# Patient Record
Sex: Male | Born: 1985 | Race: Black or African American | Hispanic: No | Smoking: Current every day smoker
Health system: Southern US, Community
[De-identification: ages and names within clinical notes are randomized; demographics above are authoritative.]

## PROBLEM LIST (undated history)

## (undated) DIAGNOSIS — J40 Bronchitis, not specified as acute or chronic: Secondary | ICD-10-CM

## (undated) HISTORY — PX: NO PAST SURGERIES: SHX2092

---
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
## 2005-04-06 ENCOUNTER — Emergency Department (HOSPITAL_COMMUNITY): Admission: EM | Admit: 2005-04-06 | Discharge: 2005-04-07 | Payer: Self-pay | Admitting: Emergency Medicine

## 2011-07-11 ENCOUNTER — Encounter (HOSPITAL_COMMUNITY): Payer: Self-pay | Admitting: Emergency Medicine

## 2011-07-11 ENCOUNTER — Emergency Department (HOSPITAL_COMMUNITY): Payer: Self-pay

## 2011-07-11 DIAGNOSIS — IMO0002 Reserved for concepts with insufficient information to code with codable children: Secondary | ICD-10-CM | POA: Insufficient documentation

## 2011-07-11 DIAGNOSIS — Y92009 Unspecified place in unspecified non-institutional (private) residence as the place of occurrence of the external cause: Secondary | ICD-10-CM | POA: Insufficient documentation

## 2011-07-11 DIAGNOSIS — S63269A Dislocation of metacarpophalangeal joint of unspecified finger, initial encounter: Secondary | ICD-10-CM | POA: Insufficient documentation

## 2011-07-11 NOTE — ED Notes (Signed)
Pt punched refrigerator. Right hand swollen and deformed. Pt cant move at all. Radial pulse intact.

## 2011-07-12 ENCOUNTER — Emergency Department (HOSPITAL_COMMUNITY)
Admission: EM | Admit: 2011-07-12 | Discharge: 2011-07-12 | Disposition: A | Discharge status: Home / Self Care | Payer: Self-pay | Attending: Emergency Medicine | Admitting: Emergency Medicine

## 2011-07-12 ENCOUNTER — Emergency Department (HOSPITAL_COMMUNITY): Payer: Self-pay

## 2011-07-12 DIAGNOSIS — IMO0002 Reserved for concepts with insufficient information to code with codable children: Secondary | ICD-10-CM

## 2011-07-12 MED ORDER — HYDROCODONE-ACETAMINOPHEN 5-500 MG PO TABS: 1.0000 | ORAL_TABLET | Freq: Four times a day (QID) | ORAL | Status: DC | PRN

## 2011-07-12 NOTE — ED Provider Notes (Signed)
Medical screening examination/treatment/procedure(s) were conducted as a shared visit with non-physician practitioner(s) and myself.  I personally evaluated the patient during the encounter. Patient evaluated and dislocation reduced by myself and GS per documentation. Hand was splinted and post reduction x-ray reviewed. Patient is neurovascularly intact. Hand referral provided pain medications.   Sunnie Nielsen, MD 07/12/11 [phone]

## 2011-07-12 NOTE — ED Provider Notes (Signed)
History     CSN: [id number]  Arrival date & time 07/11/11  2325   First MD Initiated Contact with Patient 07/12/11 0015      Chief Complaint  Patient presents with  . Hand Injury    (Consider location/radiation/quality/duration/timing/severity/associated sxs/prior treatment) HPI Comments: Patient punched a refrigerator, less than 1 hour ago.  Has obvious deformity to the right fifth proximal metacarpal  Patient is a 26 y.o. male presenting with hand injury. The history is provided by the patient.  Hand Injury  The incident occurred less than 1 hour ago. The incident occurred at home. The injury mechanism was a direct blow. The pain is present in the right hand. Pertinent negatives include no fever.    No past medical history on file.  No past surgical history on file.  No family history on file.  History  Substance Use Topics  . Smoking status: Not on file  . Smokeless tobacco: Not on file  . Alcohol Use: Not on file      Review of Systems  Constitutional: Negative for fever.  Musculoskeletal: Positive for joint swelling.    Allergies  Penicillins  Home Medications   Current Outpatient Rx  Name Route Sig Dispense Refill  . HYDROCODONE-ACETAMINOPHEN 5-500 MG PO TABS Oral Take 1-2 tablets by mouth every 6 (six) hours as needed for pain. 15 tablet 0    BP 97/56  Pulse 118  Temp(Src) 97.6 F (36.4 C) (Oral)  Resp 18  SpO2 92%  Physical Exam  Constitutional: He is oriented to person, place, and time. He appears well-developed and well-nourished.  HENT:  Head: Normocephalic.  Neck: Normal range of motion.  Cardiovascular: Normal rate.   Musculoskeletal:       Obvious bony deformity of right hand at the proximal fifth metacarpal.  Color, sensation, cap refill less than 3 seconds.  Positive proximal and distal pulses-neurovascularly intact  Neurological: He is alert and oriented to person, place, and time.    ED Course  Reduction of  dislocation Date/Time: 07/12/2011 12:18 AM Performed by: Arman Filter Authorized by: Arman Filter Consent: Verbal consent obtained. Risks and benefits: risks, benefits and alternatives were discussed Consent given by: patient Patient understanding: patient states understanding of the procedure being performed Patient identity confirmed: verbally with patient Time out: Immediately prior to procedure a "time out" was called to verify the correct patient, procedure, equipment, support staff and site/side marked as required. Local anesthesia used: no Patient sedated: no Patient tolerance: Patient tolerated the procedure well with no immediate complications. Comments: Dr. Dierdre Highman, and I manually relocated the fifth metacarpal, without complication.  He is neurovascularly intact.  Full range of motion of the fourth and fifth finger right hand.  Cap refill less than 3 seconds color, and sensation normal   (including critical care time)  Labs Reviewed - No data to display Dg Hand Complete Right  07/12/2011  *RADIOLOGY REPORT*  Clinical Data: Post reduction radiographs  RIGHT HAND - COMPLETE 3+ VIEW  Comparison: Right hand radiographs - earlier same day  Findings:  Fine bony detail is obscured secondary to overlying support apparatus.  Interval relocation of previously identified dislocated fourth and fifth CMC joints.  No new fracture identified.  IMPRESSION: Interval relocation of the fourth and fifth CMC joints.  Original Report Authenticated By: Waynard Reeds, M.D.   Dg Hand Complete Right  07/12/2011  *RADIOLOGY REPORT*  Clinical Data: Right hand trauma/deformity.  RIGHT HAND - COMPLETE 3+ VIEW  Comparison:  None.  Findings: Soft tissue swelling overlies the ulnar aspect of the hand.  The fifth digit is dislocated dorsally at the carpal- metacarpal joint.  The fourth digit appears dorsally subluxed at the carpal-metacarpal joint and may be dislocated as well. Small radiopaque density along the  base of the fifth metacarpal bone may reflect a fracture.  IMPRESSION: Dislocation at the fifth carpal metacarpal joint.  Small radiopaque density along the base of the fifth metacarpal bone may reflect a fracture.  The fourth metacarpal bone also appears subluxed and may be dislocated.  Correlate with physical exam.  Original Report Authenticated By: Waneta Martins, M.D.     1. Dislocation of metacarpal joint       MDM  Carpal dislocation.  That has been relocated at time of discharge.  Splint has been placed.  Digits have been reexamined.  Cap refill pain is improved post reduction x-ray reviewed.  Good alignment.  No fracture seen        Arman Filter, NP 07/12/11 1191  Arman Filter, NP 07/12/11 0454  Arman Filter, NP 07/12/11 0454  Arman Filter, NP 07/12/11 [phone]

## 2011-07-12 NOTE — ED Notes (Signed)
Ortho tech reports he applied buddy tape and (R) hand splint. Confirmed w/Gail EDNP proper splint applied, she informs writer we are waiting for a (R) hand XR post reduction

## 2011-07-12 NOTE — Discharge Instructions (Signed)
Metacarpophalangeal Joint Dislocation You have a finger dislocation of the metacarpophalangeal (MCP) joint . This is the joint between the large bones in your hand and the bones of the finger that are closest to the hand (the knuckles). The most common fingers to dislocate in this area are the index finger and the thumb. Usually it occurs because of a hyper-extension injury. This means the finger or thumb has been forced to bend opposite of the way the fingers normally bend. A dislocation is a condition in which joint surfaces are forced into an abnormal relationship. These joint surfaces are normally next to each other. With this injury, the joint surfaces no longer butt against (oppose) each other. When this happens, X-rays are often taken. The x-rays are used to make sure a break in bone (fracture) is not present. These fractures are often difficult to bring back to normal position (reduce). If there is a fracture, you may require surgery. TREATMENT  If the joint is dislocated you will require a reduction (re-alignment) of the joint. This is done with a local anesthetic or regional anesthetic (make the arm numb). Once reduced the injury is usually treated by splinting the joint in a flexed position. This keeps the joint stable and unable to have the final degrees of extension. You may have the split for several weeks. If there are related (associated ) fractures, you may require surgery with repair and internal fixation. Your caregiver can discuss these options with you. HOME CARE INSTRUCTIONS   While awake, apply ice to the sore area for 15 to 20 minutes, 3 to 4 times per day for the first 2 days. Put the ice in a plastic bag. Place a towel between the bag of ice and your skin.   Keep your arm raised (elevated) above your heart when possible. This will lessen swelling.   Continue activities with your hand as directed   Only take over-the-counter or prescription medicines for pain, discomfort, or  fever as directed by your caregiver.  SEEK MEDICAL CARE IF:   You have an increase in bruising, swelling or pain in your finger   You notice coldness or numbness of your finger.   You do not get enough pain relief with medications.  SEEK IMMEDIATE MEDICAL CARE IF: Your finger is progressively numb or blue, or you have severe pain. MAKE SURE YOU:   Understand these instructions.   Will watch your condition.   Will get help right away if you are not doing well or get worse.  Document Released: 02/26/2000 Document Revised: 02/17/2011 Document Reviewed: 07/21/2008 [facility] Patient Information 2012 [place], [place]. Please keep the splint in place since he was seen by Dr. Park Pope, please call and make an appointment for 7-10 days from now

## 2011-07-14 ENCOUNTER — Encounter (HOSPITAL_COMMUNITY): Payer: Self-pay | Admitting: *Deleted

## 2011-07-14 ENCOUNTER — Encounter (HOSPITAL_COMMUNITY): Payer: Self-pay | Admitting: Pharmacy Technician

## 2011-07-14 ENCOUNTER — Other Ambulatory Visit: Payer: Self-pay | Admitting: Orthopedic Surgery

## 2011-07-14 MED ORDER — CHLORHEXIDINE GLUCONATE 4 % EX LIQD: 60.0000 mL | Freq: Once | CUTANEOUS | Status: DC

## 2011-07-15 ENCOUNTER — Encounter (HOSPITAL_COMMUNITY): Payer: Self-pay | Admitting: *Deleted

## 2011-07-15 ENCOUNTER — Encounter (HOSPITAL_COMMUNITY)
Admission: RE | Disposition: A | Discharge status: Home / Self Care | Payer: Self-pay | Source: Ambulatory Visit | Attending: Orthopedic Surgery

## 2011-07-15 ENCOUNTER — Encounter (HOSPITAL_COMMUNITY): Payer: Self-pay | Admitting: Anesthesiology

## 2011-07-15 ENCOUNTER — Ambulatory Visit (HOSPITAL_COMMUNITY): Payer: Self-pay | Admitting: Anesthesiology

## 2011-07-15 ENCOUNTER — Ambulatory Visit (HOSPITAL_COMMUNITY)
Admission: RE | Admit: 2011-07-15 | Discharge: 2011-07-15 | Disposition: A | Discharge status: Home / Self Care | Payer: Self-pay | Source: Ambulatory Visit | Attending: Orthopedic Surgery | Admitting: Orthopedic Surgery

## 2011-07-15 DIAGNOSIS — X58XXXA Exposure to other specified factors, initial encounter: Secondary | ICD-10-CM | POA: Insufficient documentation

## 2011-07-15 DIAGNOSIS — S62309A Unspecified fracture of unspecified metacarpal bone, initial encounter for closed fracture: Secondary | ICD-10-CM

## 2011-07-15 DIAGNOSIS — F17200 Nicotine dependence, unspecified, uncomplicated: Secondary | ICD-10-CM | POA: Insufficient documentation

## 2011-07-15 DIAGNOSIS — S62109A Fracture of unspecified carpal bone, unspecified wrist, initial encounter for closed fracture: Secondary | ICD-10-CM | POA: Insufficient documentation

## 2011-07-15 HISTORY — PX: PERCUTANEOUS PINNING: SHX2209

## 2011-07-15 LAB — SURGICAL PCR SCREEN
MRSA, PCR: NEGATIVE
Staphylococcus aureus: NEGATIVE

## 2011-07-15 SURGERY — PINNING, EXTREMITY, PERCUTANEOUS
Anesthesia: General | Site: Hand | Laterality: Right | Wound class: Clean

## 2011-07-15 MED ORDER — MIDAZOLAM HCL 5 MG/5ML IJ SOLN
INTRAMUSCULAR | Status: DC | PRN
  Administered 2011-07-15: 2 mg via INTRAVENOUS

## 2011-07-15 MED ORDER — ONDANSETRON HCL 4 MG/2ML IJ SOLN
INTRAMUSCULAR | Status: DC | PRN
  Administered 2011-07-15: 4 mg via INTRAVENOUS

## 2011-07-15 MED ORDER — MUPIROCIN 2 % EX OINT
TOPICAL_OINTMENT | CUTANEOUS | Status: AC
  Administered 2011-07-15: 1 via NASAL
  Filled 2011-07-15: qty 22

## 2011-07-15 MED ORDER — HYDROMORPHONE HCL PF 1 MG/ML IJ SOLN
0.2500 mg | INTRAMUSCULAR | Status: DC | PRN
  Administered 2011-07-15: 0.5 mg via INTRAVENOUS

## 2011-07-15 MED ORDER — CEFAZOLIN SODIUM 1-5 GM-% IV SOLN
INTRAVENOUS | Status: DC | PRN
  Administered 2011-07-15: 1 g via INTRAVENOUS

## 2011-07-15 MED ORDER — FENTANYL CITRATE 0.05 MG/ML IJ SOLN
INTRAMUSCULAR | Status: DC | PRN
  Administered 2011-07-15 (×3): 50 ug via INTRAVENOUS

## 2011-07-15 MED ORDER — PROPOFOL 10 MG/ML IV EMUL
INTRAVENOUS | Status: DC | PRN
  Administered 2011-07-15: 200 mg via INTRAVENOUS

## 2011-07-15 MED ORDER — CEFAZOLIN SODIUM 1-5 GM-% IV SOLN
INTRAVENOUS | Status: AC
  Filled 2011-07-15: qty 50

## 2011-07-15 MED ORDER — LIDOCAINE HCL 1 % IJ SOLN
INTRAMUSCULAR | Status: DC | PRN
  Administered 2011-07-15: 50 mg via INTRADERMAL

## 2011-07-15 MED ORDER — LACTATED RINGERS IV SOLN
INTRAVENOUS | Status: DC
  Administered 2011-07-15: 14:00:00 via INTRAVENOUS

## 2011-07-15 MED ORDER — LACTATED RINGERS IV SOLN
INTRAVENOUS | Status: DC | PRN
  Administered 2011-07-15: 15:00:00 via INTRAVENOUS

## 2011-07-15 MED ORDER — BUPIVACAINE HCL (PF) 0.25 % IJ SOLN
INTRAMUSCULAR | Status: DC | PRN
  Administered 2011-07-15: 7 mL

## 2011-07-15 MED ORDER — OXYCODONE-ACETAMINOPHEN 5-325 MG PO TABS: 1.0000 | ORAL_TABLET | ORAL | Status: AC | PRN

## 2011-07-15 MED ORDER — ONDANSETRON HCL 4 MG/2ML IJ SOLN: 4.0000 mg | Freq: Four times a day (QID) | INTRAMUSCULAR | Status: DC | PRN

## 2011-07-15 SURGICAL SUPPLY — 43 items
BANDAGE ELASTIC 3 VELCRO ST LF (GAUZE/BANDAGES/DRESSINGS) IMPLANT
BANDAGE ELASTIC 4 VELCRO ST LF (GAUZE/BANDAGES/DRESSINGS) ×2 IMPLANT
BANDAGE GAUZE ELAST BULKY 4 IN (GAUZE/BANDAGES/DRESSINGS) ×2 IMPLANT
BNDG CMPR 9X4 STRL LF SNTH (GAUZE/BANDAGES/DRESSINGS) ×1
BNDG ESMARK 4X9 LF (GAUZE/BANDAGES/DRESSINGS) ×2 IMPLANT
CAP PIN ORTHO PINK (CAP) IMPLANT
CAP PIN PROTECTOR ORTHO WHT (CAP) IMPLANT
CLOTH BEACON ORANGE TIMEOUT ST (SAFETY) ×2 IMPLANT
CORDS BIPOLAR (ELECTRODE) IMPLANT
CUFF TOURNIQUET SINGLE 18IN (TOURNIQUET CUFF) ×2 IMPLANT
CUFF TOURNIQUET SINGLE 24IN (TOURNIQUET CUFF) IMPLANT
DRAPE SURG 17X23 STRL (DRAPES) ×2 IMPLANT
DRAPE U-SHAPE 47X51 STRL (DRAPES) IMPLANT
ELECT REM PT RETURN 9FT ADLT (ELECTROSURGICAL)
ELECTRODE REM PT RTRN 9FT ADLT (ELECTROSURGICAL) IMPLANT
GAUZE XEROFORM 1X8 LF (GAUZE/BANDAGES/DRESSINGS) ×2 IMPLANT
GLOVE BIO SURGEON STRL SZ 6 (GLOVE) ×2 IMPLANT
GLOVE BIO SURGEON STRL SZ8.5 (GLOVE) ×2 IMPLANT
GLOVE BIOGEL PI IND STRL 6.5 (GLOVE) ×1 IMPLANT
GLOVE BIOGEL PI IND STRL 7.5 (GLOVE) ×1 IMPLANT
GLOVE BIOGEL PI INDICATOR 6.5 (GLOVE) ×1
GLOVE BIOGEL PI INDICATOR 7.5 (GLOVE) ×1
GLOVE SURG SS PI 7.5 STRL IVOR (GLOVE) ×2 IMPLANT
GOWN SRG XL XLNG 56XLVL 4 (GOWN DISPOSABLE) ×1 IMPLANT
GOWN STRL NON-REIN LRG LVL3 (GOWN DISPOSABLE) ×2 IMPLANT
GOWN STRL NON-REIN XL XLG LVL4 (GOWN DISPOSABLE) ×2
GOWN STRL REIN XL XLG (GOWN DISPOSABLE) ×2 IMPLANT
K-WIRE SMTH SNGL TROCAR .028X4 (WIRE)
KIT BASIN OR (CUSTOM PROCEDURE TRAY) ×2 IMPLANT
KIT ROOM TURNOVER OR (KITS) ×2 IMPLANT
KWIRE SMTH SNGL TROCAR .028X4 (WIRE) IMPLANT
MANIFOLD NEPTUNE II (INSTRUMENTS) IMPLANT
NEEDLE HYPO 25GX1X1/2 BEV (NEEDLE) ×2 IMPLANT
NS IRRIG 1000ML POUR BTL (IV SOLUTION) ×2 IMPLANT
PACK ORTHO EXTREMITY (CUSTOM PROCEDURE TRAY) ×2 IMPLANT
PAD ARMBOARD 7.5X6 YLW CONV (MISCELLANEOUS) ×2 IMPLANT
PAD CAST 4YDX4 CTTN HI CHSV (CAST SUPPLIES) ×1 IMPLANT
PADDING CAST COTTON 4X4 STRL (CAST SUPPLIES) ×2
SPONGE GAUZE 4X4 12PLY (GAUZE/BANDAGES/DRESSINGS) ×2 IMPLANT
TOWEL OR 17X24 6PK STRL BLUE (TOWEL DISPOSABLE) ×2 IMPLANT
TOWEL OR 17X26 10 PK STRL BLUE (TOWEL DISPOSABLE) ×2 IMPLANT
UNDERPAD 30X30 INCONTINENT (UNDERPADS AND DIAPERS) ×2 IMPLANT
WATER STERILE IRR 1000ML POUR (IV SOLUTION) IMPLANT

## 2011-07-15 NOTE — Brief Op Note (Signed)
07/15/2011  3:49 PM  PATIENT:  Darius Bailey  26 y.o. male  PRE-OPERATIVE DIAGNOSIS:  RIGHT SMALL/RING CMC DISLOCATION  POST-OPERATIVE DIAGNOSIS:  RIGHT SMALL/RING CMC DISLOCATION  PROCEDURE:  Procedure(s) (LRB): PERCUTANEOUS PINNING EXTREMITY (Right)  SURGEON:  Surgeon(s) and Role:    * Marlowe Shores, MD - Primary  PHYSICIAN ASSISTANT:   ASSISTANTS: none   ANESTHESIA:   general  EBL:  Total I/O In: 1000 [I.V.:1000] Out: -   BLOOD ADMINISTERED:none  DRAINS: none   LOCAL MEDICATIONS USED:  MARCAINE   10cc  SPECIMEN:  No Specimen  DISPOSITION OF SPECIMEN:  N/A  COUNTS:  YES  TOURNIQUET:   Total Tourniquet Time Documented: Upper Arm (Right) - 8 minutes  DICTATION: .Other Dictation: Dictation Number [id number]  PLAN OF CARE: Discharge to home after PACU  PATIENT DISPOSITION:  PACU - hemodynamically stable.   Delay start of Pharmacological VTE agent (>24hrs) due to surgical blood loss or risk of bleeding: not applicable

## 2011-07-15 NOTE — H&P (Signed)
Darius Bailey is an 26 y.o. male.   Chief Complaint: right hand pain HPI: right 4 and 5 cmcf fx/dx  History reviewed. No pertinent past medical history.  Past Surgical History  Procedure Date  . No past surgeries     Family History  Problem Relation Age of Onset  . Anesthesia problems Neg Hx    Social History:  reports that he has been smoking Cigarettes.  He has been smoking about .25 packs per day. He does not have any smokeless tobacco history on file. He reports that he drinks alcohol. He reports that he uses illicit drugs (Marijuana) about once per week.  Allergies:  Allergies  Allergen Reactions  . Penicillins Other (See Comments)    unknown    Medications Prior to Admission  Medication Sig Dispense Refill  . HYDROcodone-acetaminophen (VICODIN) 5-500 MG per tablet Take 1-2 tablets by mouth every 6 (six) hours as needed. pain        Results for orders placed during the hospital encounter of 07/15/11 (from the past 48 hour(s))  SURGICAL PCR SCREEN     Status: Normal   Collection Time   07/15/11 11:28 AM      Component Value Range Comment   MRSA, PCR NEGATIVE  NEGATIVE     Staphylococcus aureus NEGATIVE  NEGATIVE     No results found.  Review of Systems  All other systems reviewed and are negative.    Blood pressure 104/67, pulse 54, temperature 98 F (36.7 C), temperature source Oral, resp. rate 20, height 5\' 9"  (1.753 m), weight 77.111 kg (170 lb), SpO2 99.00%. Physical Exam  Constitutional: He is oriented to person, place, and time. He appears well-developed and well-nourished.  HENT:  Head: Normocephalic and atraumatic.  Cardiovascular: Normal rate.   Respiratory: Effort normal.  Musculoskeletal:       Right hand: He exhibits tenderness, bony tenderness and swelling.  Neurological: He is alert and oriented to person, place, and time.  Skin: Skin is warm.  Psychiatric: He has a normal mood and affect. His speech is normal and behavior is normal. Thought  content normal.     Assessment/Plan As above   Plan reduction and pinning Anesa Fronek A 07/15/2011, 2:44 PM

## 2011-07-15 NOTE — Transfer of Care (Signed)
Immediate Anesthesia Transfer of Care Note  Patient: Darius Bailey  Procedure(s) Performed: Procedure(s) (LRB): PERCUTANEOUS PINNING EXTREMITY (Right)  Patient Location: PACU  Anesthesia Type: General  Level of Consciousness: awake, sedated and patient cooperative  Airway & Oxygen Therapy: Patient Spontanous Breathing and Patient connected to nasal cannula oxygen  Post-op Assessment: Report given to PACU RN and Post -op Vital signs reviewed and stable  Post vital signs: stable  Complications: No apparent anesthesia complications

## 2011-07-15 NOTE — Anesthesia Postprocedure Evaluation (Signed)
  Anesthesia Post-op Note  Patient: Darius Bailey  Procedure(s) Performed: Procedure(s) (LRB): PERCUTANEOUS PINNING EXTREMITY (Right)  Patient Location: PACU  Anesthesia Type: General  Level of Consciousness: awake, oriented, sedated and patient cooperative  Airway and Oxygen Therapy: Patient Spontanous Breathing and Patient connected to nasal cannula oxygen  Post-op Pain: mild  Post-op Assessment: Post-op Vital signs reviewed, Patient's Cardiovascular Status Stable, Respiratory Function Stable, Patent Airway, No signs of Nausea or vomiting and Pain level controlled  Post-op Vital Signs: stable  Complications: No apparent anesthesia complications

## 2011-07-15 NOTE — Preoperative (Signed)
Beta Blockers   Reason not to administer Beta Blockers:Not Applicable 

## 2011-07-15 NOTE — Op Note (Signed)
See dictated note [phone]

## 2011-07-15 NOTE — Anesthesia Preprocedure Evaluation (Addendum)
Anesthesia Evaluation  Patient identified by MRN, date of birth, ID band Patient awake    Reviewed: Allergy & Precautions, H&P , NPO status , Patient's Chart, lab work & pertinent test results  Airway Mallampati: II  Neck ROM: full    Dental  (+) Teeth Intact and Dental Advisory Given   Pulmonary Current Smoker,          Cardiovascular     Neuro/Psych    GI/Hepatic   Endo/Other    Renal/GU      Musculoskeletal   Abdominal   Peds  Hematology   Anesthesia Other Findings   Reproductive/Obstetrics                          Anesthesia Physical Anesthesia Plan  ASA: II  Anesthesia Plan: General   Post-op Pain Management:    Induction: Intravenous  Airway Management Planned: LMA  Additional Equipment:   Intra-op Plan:   Post-operative Plan:   Informed Consent: I have reviewed the patients History and Physical, chart, labs and discussed the procedure including the risks, benefits and alternatives for the proposed anesthesia with the patient or authorized representative who has indicated his/her understanding and acceptance.     Plan Discussed with: CRNA and Surgeon  Anesthesia Plan Comments:         Anesthesia Quick Evaluation

## 2011-07-15 NOTE — Op Note (Signed)
[name], Darius Bailey NO.:  [id number]  MEDICAL RECORD NO.:  [id number]  LOCATION:  [facility]                         FACILITY:  [facility]  PHYSICIAN:  Artist Pais. Dylyn Mclaren, M.D.DATE OF BIRTH:  11/07/1985  DATE OF PROCEDURE:  07/15/2011 DATE OF DISCHARGE:  07/15/2011                              OPERATIVE REPORT   PREOPERATIVE DIAGNOSIS:  Right hand 4th and 5th CMC fracture dislocations.  POSTOPERATIVE DIAGNOSIS:  Right hand 4th and 5th CMC fracture dislocations.  PROCEDURES:  Closed reduction and pinning above with 0.062 K-wire x2.  SURGEON:  Artist Pais. Mina Marble, M.D.  ASSISTANT:  None.  ANESTHESIA:  General.  COMPLICATIONS:  No complication.  DRAINS:  No drains.  DESCRIPTION OF PROCEDURE:  The patient was taken to operating suite. After induction of general anesthesia, right upper extremity was prepped and draped in sterile fashion.  An Esmarch was used to exsanguinate the limb.  Tourniquet was then inflated to 250 mmHg.  At this point in time, longitudinal traction and downward pressure was placed on the 4th and 5th CMC joints which had been reduced in the ER but had subluxed dorsally. Reduction was performed.  A 0.062 K-wire was then driven from ulnar to radial from the 5th to 4th into the long finger to maintain reduction.  These were driven from volar to dorsal, from ulnar to radial.  Intraoperative fluoroscopy revealed adequate reduction in AP and lateral oblique view.  The K-wires were cut outside the skin and bent upon themselves.  Dressed with Xeroform, 4x4s, fluffs, and ulnar gutter splint.  The patient tolerated the procedure well and went to recovery room in stable fashion.     Artist Pais Mina Marble, M.D.     MAW/MEDQ  D:  07/15/2011  T:  07/15/2011  Job:  [id number]

## 2011-07-15 NOTE — Discharge Instructions (Signed)
Hand Fracture Your caregiver has diagnosed you with a fractured (broken) bone in your hand. If the bones are in good position and the hand is properly immobilized and rested, these injuries will usually heal in 3 to 6 weeks. A cast, splint, or bulky bandage is usually applied to keep the fracture site from moving. Do not remove the splint or cast until your caregiver approves. If the fracture is unstable or the bones are not aligned properly, surgery may be needed. Keep your hand raised (elevated) above the level of your heart as much as possible for the next 2 to 3 days until the swelling and pain are better. Apply ice packs for 15 to 20 minutes every 3 to 4 hours to help control the pain and swelling. See your caregiver or an orthopedic specialist as directed for follow-up care to make sure the fracture is beginning to heal properly. SEEK IMMEDIATE MEDICAL CARE IF:   You notice your fingers are cold, numb, crooked, or the pain of your injury is severe.   You are not improving or seem to be getting worse.   You have questions or concerns.  Document Released: 04/07/2004 Document Revised: 02/17/2011 Document Reviewed: 08/26/2008 [employer] Patient Information 2012 [employer]. 

## 2011-07-18 ENCOUNTER — Encounter (HOSPITAL_COMMUNITY): Payer: Self-pay | Admitting: Orthopedic Surgery

## 2011-07-28 ENCOUNTER — Ambulatory Visit: Payer: Self-pay | Attending: Orthopedic Surgery | Admitting: Occupational Therapy

## 2011-08-16 ENCOUNTER — Encounter: Payer: Self-pay | Admitting: Occupational Therapy

## 2012-03-23 ENCOUNTER — Emergency Department (HOSPITAL_COMMUNITY)
Admission: EM | Admit: 2012-03-23 | Discharge: 2012-03-23 | Disposition: A | Discharge status: Home / Self Care | Payer: Self-pay | Attending: Emergency Medicine | Admitting: Emergency Medicine

## 2012-03-23 ENCOUNTER — Encounter (HOSPITAL_COMMUNITY): Payer: Self-pay

## 2012-03-23 ENCOUNTER — Emergency Department (HOSPITAL_COMMUNITY): Payer: Self-pay

## 2012-03-23 DIAGNOSIS — J069 Acute upper respiratory infection, unspecified: Secondary | ICD-10-CM | POA: Insufficient documentation

## 2012-03-23 DIAGNOSIS — Z8709 Personal history of other diseases of the respiratory system: Secondary | ICD-10-CM | POA: Insufficient documentation

## 2012-03-23 DIAGNOSIS — F17200 Nicotine dependence, unspecified, uncomplicated: Secondary | ICD-10-CM | POA: Insufficient documentation

## 2012-03-23 DIAGNOSIS — R51 Headache: Secondary | ICD-10-CM | POA: Insufficient documentation

## 2012-03-23 DIAGNOSIS — R6883 Chills (without fever): Secondary | ICD-10-CM | POA: Insufficient documentation

## 2012-03-23 DIAGNOSIS — R319 Hematuria, unspecified: Secondary | ICD-10-CM | POA: Insufficient documentation

## 2012-03-23 DIAGNOSIS — R52 Pain, unspecified: Secondary | ICD-10-CM | POA: Insufficient documentation

## 2012-03-23 HISTORY — DX: Bronchitis, not specified as acute or chronic: J40

## 2012-03-23 LAB — URINALYSIS, ROUTINE W REFLEX MICROSCOPIC
Bilirubin Urine: NEGATIVE
Nitrite: NEGATIVE
Specific Gravity, Urine: 1.02 (ref 1.005–1.030)
Urobilinogen, UA: 1 mg/dL (ref 0.0–1.0)

## 2012-03-23 LAB — CBC WITH DIFFERENTIAL/PLATELET
Eosinophils Absolute: 0.2 10*3/uL (ref 0.0–0.7)
Eosinophils Relative: 6 % — ABNORMAL HIGH (ref 0–5)
MCH: 31.6 pg (ref 26.0–34.0)
Monocytes Absolute: 0.8 10*3/uL (ref 0.1–1.0)
Neutrophils Relative %: 31 % — ABNORMAL LOW (ref 43–77)
Platelets: 157 10*3/uL (ref 150–400)
RBC: 4.52 MIL/uL (ref 4.22–5.81)

## 2012-03-23 LAB — URINE MICROSCOPIC-ADD ON

## 2012-03-23 LAB — COMPREHENSIVE METABOLIC PANEL
ALT: 9 U/L (ref 0–53)
AST: 16 U/L (ref 0–37)
Albumin: 3.8 g/dL (ref 3.5–5.2)
Calcium: 8.9 mg/dL (ref 8.4–10.5)
Sodium: 139 mEq/L (ref 135–145)
Total Protein: 6.8 g/dL (ref 6.0–8.3)

## 2012-03-23 MED ORDER — HYDROCODONE-HOMATROPINE 5-1.5 MG/5ML PO SYRP: 5.0000 mL | ORAL_SOLUTION | Freq: Four times a day (QID) | ORAL | Status: DC | PRN

## 2012-03-23 MED ORDER — GUAIFENESIN ER 600 MG PO TB12: 1200.0000 mg | ORAL_TABLET | Freq: Two times a day (BID) | ORAL | Status: DC

## 2012-03-23 NOTE — ED Notes (Signed)
Pt denies N/V/D at this time. Reports minimal diarrhea 3 days ago that resolved on its own.

## 2012-03-23 NOTE — ED Notes (Signed)
Cough, body aches, headache, diarheea, decreased appetite, symptoms started Tuesday,.

## 2012-03-23 NOTE — ED Provider Notes (Signed)
History     CSN: [id number]  Arrival date & time 03/23/12  4098   First MD Initiated Contact with Patient 03/23/12 0901      Chief Complaint  Patient presents with  . Cough    (Consider location/radiation/quality/duration/timing/severity/associated sxs/prior treatment) HPI No presents emergency department with chief complaint of cough, bodyaches, chills, headache, decreased appetite.  Onset was 2 date, 3 days ago.  Patient states that he has a past medical history of bronchitis as a child.  He doesn't smoke cigarettes or a history of asthma.  Patient also complains of intermittent rectal bleeding.  He did not have any pain with bleeding.  It is a small amount of blood.  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  Is unsure if he has a past history of internal hemorrhoid.  He denies any rectal itching. chest X-ray negative  Denies DOE, SOB, chest tightness or pressure, radiation to left arm, jaw or back, or diaphoresis. Denies dysuria, flank pain, suprapubic pain, frequency, urgency, or hematuria. Denies headaches, light headedness, weakness, visual disturbances. Denies abdominal pain, nausea, vomiting, diarrhea or constipation.    Past Medical History  Diagnosis Date  . Bronchitis     Past Surgical History  Procedure Date  . No past surgeries   . Percutaneous pinning 07/15/2011    Procedure: PERCUTANEOUS PINNING EXTREMITY;  Surgeon: Marlowe Shores, MD;  Location: [facility] OR;  Service: Orthopedics;  Laterality: Right;  right ring and small metacarpal    Family History  Problem Relation Age of Onset  . Anesthesia problems Neg Hx     History  Substance Use Topics  . Smoking status: Current Every Day Smoker -- 0.2 packs/day    Types: Cigarettes  . Smokeless tobacco: Not on file  . Alcohol Use: Yes     Comment: 5 shots      Review of Systems Ten systems reviewed and are negative for acute change, except as noted in the HPI.   Allergies  Penicillins  Home Medications    Current Outpatient Rx  Name  Route  Sig  Dispense  Refill  . NAPROXEN 375 MG PO TABS   Oral   Take 375 mg by mouth once.           BP 116/57  Pulse 56  Temp 98.4 F (36.9 C)  Resp 16  Ht 5\' 9"  (1.753 m)  Wt 173 lb (78.472 kg)  BMI 25.55 kg/m2  Physical Exam Appears moderately ill but not toxic; temperature as noted in vitals. Ears normal. Eyes:glassy appearance, no discharge  Heart: RRR, NO M/G/R Throat and pharynx normal.   Neck supple. No adenopathyhy in the neck.  Sinuses non tender.  Chest with ronchi.Clear with cough. Abdomen is soft and nontender Digital Rectal Exam reveals sphincter with good tone. No external hemorrhoids. No masses or fissures. Stool color is brown with no overt blood.   ED Course  Procedures (including critical care time)   Labs Reviewed  URINALYSIS, ROUTINE W REFLEX MICROSCOPIC  CBC WITH DIFFERENTIAL  COMPREHENSIVE METABOLIC PANEL   Dg Chest 2 View  03/23/2012  *RADIOLOGY REPORT*  Clinical Data: Smoker with cough, chest congestion, and chest pain over the past 3 days.  CHEST - 2 VIEW  Comparison: None.  Findings: Cardiomediastinal silhouette unremarkable.  Lungs clear. Bronchovascular markings normal.  Pulmonary vascularity normal.  No pleural effusions.  No pneumothorax.  Visualized bony thorax intact.  IMPRESSION: Normal examination.   Original Report Authenticated By: Hulan Saas, M.D.  No diagnosis found.    MDM  10:17 AM Patient with multpile complaints. I believe the rectal bleeding is likely secondary to hemorrhoids.  Awaiting basic labs.    12:00 PM Patient with large hgb on UA. He has not noted any visible blood.  Renal function is normal.  I will have offered the patient followup with urology, however the patient states she will not be able to afford this.  Alternatively I suggested patient may return in one month for repeat urinalysis.  He likely has a viral nephritis of some sort.  Patient states he he will  return in one month for repeat UA.  If positive at that time and he'll have a urology followup.. Pt CXR negative for acute infiltrate. Patients symptoms are consistent with URI, likely viral etiology. Discussed that antibiotics are not indicated for viral infections. Pt will be discharged with symptomatic treatment.  Verbalizes understanding and is agreeable with plan. Pt is hemodynamically stable & in NAD prior to dc.      Arthor Captain, PA-C 03/23/12 1205

## 2012-03-23 NOTE — ED Provider Notes (Signed)
Medical screening examination/treatment/procedure(s) were performed by non-physician practitioner and as supervising physician I was immediately available for consultation/collaboration.   Kedar Sedano, MD 03/23/12 1710 

## 2012-03-26 LAB — PATHOLOGIST SMEAR REVIEW

## 2018-02-11 ENCOUNTER — Emergency Department (HOSPITAL_COMMUNITY): Payer: Self-pay

## 2018-02-11 ENCOUNTER — Encounter (HOSPITAL_COMMUNITY): Payer: Self-pay

## 2018-02-11 DIAGNOSIS — J111 Influenza due to unidentified influenza virus with other respiratory manifestations: Secondary | ICD-10-CM | POA: Insufficient documentation

## 2018-02-11 DIAGNOSIS — Z880 Allergy status to penicillin: Secondary | ICD-10-CM | POA: Insufficient documentation

## 2018-02-11 DIAGNOSIS — F17210 Nicotine dependence, cigarettes, uncomplicated: Secondary | ICD-10-CM | POA: Insufficient documentation

## 2018-02-11 NOTE — ED Triage Notes (Signed)
Pt arrived with complaints of flu like symptoms stating 2 days ago he started having a sore throat, coughing up mucus, diarrhea, generalized body aches and urinary frequency.

## 2018-02-12 ENCOUNTER — Emergency Department (HOSPITAL_COMMUNITY)
Admission: EM | Admit: 2018-02-12 | Discharge: 2018-02-12 | Disposition: A | Discharge status: Home / Self Care | Payer: Self-pay | Attending: Emergency Medicine | Admitting: Emergency Medicine

## 2018-02-12 DIAGNOSIS — R69 Illness, unspecified: Secondary | ICD-10-CM

## 2018-02-12 DIAGNOSIS — J111 Influenza due to unidentified influenza virus with other respiratory manifestations: Secondary | ICD-10-CM

## 2018-02-12 LAB — URINALYSIS, ROUTINE W REFLEX MICROSCOPIC
BILIRUBIN URINE: NEGATIVE
Bacteria, UA: NONE SEEN
GLUCOSE, UA: NEGATIVE mg/dL
Ketones, ur: NEGATIVE mg/dL
NITRITE: NEGATIVE
Protein, ur: NEGATIVE mg/dL
SPECIFIC GRAVITY, URINE: 1.002 — AB (ref 1.005–1.030)
pH: 6 (ref 5.0–8.0)

## 2018-02-12 LAB — GROUP A STREP BY PCR: GROUP A STREP BY PCR: NOT DETECTED

## 2018-02-12 LAB — GLUCOSE, CAPILLARY: GLUCOSE-CAPILLARY: 123 mg/dL — AB (ref 70–99)

## 2018-02-12 MED ORDER — OXYMETAZOLINE HCL 0.05 % NA SOLN
2.0000 | Freq: Two times a day (BID) | NASAL | Status: DC | PRN
  Administered 2018-02-12: 2 via NASAL
  Filled 2018-02-12: qty 15

## 2018-02-12 MED ORDER — ALBUTEROL SULFATE HFA 108 (90 BASE) MCG/ACT IN AERS
2.0000 | INHALATION_SPRAY | Freq: Once | RESPIRATORY_TRACT | Status: AC
  Administered 2018-02-12: 2 via RESPIRATORY_TRACT
  Filled 2018-02-12: qty 6.7

## 2018-02-12 MED ORDER — AEROCHAMBER PLUS FLO-VU MISC
1.0000 | Freq: Once | Status: AC
  Administered 2018-02-12: 1
  Filled 2018-02-12: qty 1

## 2018-02-12 MED ORDER — PROMETHAZINE HCL 25 MG PO TABS: 25.0000 mg | ORAL_TABLET | Freq: Four times a day (QID) | ORAL | 0 refills | Status: DC | PRN

## 2018-02-12 NOTE — ED Provider Notes (Signed)
[facility]-EMERGENCY DEPT Provider Note: Lowella Dell, MD, FACEP  CSN: [id number] MRN: [id number] ARRIVAL: 02/11/18 at 2238 ROOM: WA21/[facility]21   CHIEF COMPLAINT  Flu-Like Symptoms   HISTORY OF PRESENT ILLNESS  02/12/18 12:35 AM Darius Bailey is a 32 y.o. male with a 3-day history of flulike symptoms.  It began with body aches, subjective fever and nasal congestion.  He is now complaining of sore throat which feels like something is stuck in his throat, cough, nausea without vomiting, diarrhea and frequent urination.  He is also complaining of being unable to sleep and has not slept in 2 days.  He has been taking over-the-counter cough and cold medication without adequate relief.  Symptoms are moderate.   Past Medical History:  Diagnosis Date  . Bronchitis     Past Surgical History:  Procedure Laterality Date  . NO PAST SURGERIES    . PERCUTANEOUS PINNING  07/15/2011   Procedure: PERCUTANEOUS PINNING EXTREMITY;  Surgeon: Marlowe Shores, MD;  Location: [facility] OR;  Service: Orthopedics;  Laterality: Right;  right ring and small metacarpal    Family History  Problem Relation Age of Onset  . Anesthesia problems Neg Hx     Social History   Tobacco Use  . Smoking status: Current Every Day Smoker    Packs/day: 0.25    Types: Cigarettes  . Smokeless tobacco: Never Used  Substance Use Topics  . Alcohol use: Yes    Comment: 5 shots  . Drug use: Yes    Frequency: 1.0 times per week    Types: Marijuana    Prior to Admission medications   Not on File    Allergies Penicillins   REVIEW OF SYSTEMS  Negative except as noted here or in the History of Present Illness.   PHYSICAL EXAMINATION  Initial Vital Signs Blood pressure 100/66, pulse 84, temperature 98.5 F (36.9 C), temperature source Oral, resp. rate 16, height 5\' 8"  (1.727 m), weight 77.1 kg, SpO2 97 %.  Examination General: Well-developed, well-nourished male in no acute distress; appearance consistent with age of  record HENT: normocephalic; atraumatic; nasal congestion; pharyngeal erythema without exudate Eyes: pupils equal, round and reactive to light; extraocular muscles intact Neck: supple Heart: regular rate and rhythm Lungs: Shallow breaths without frank wheezing Abdomen: soft; nondistended; nontender; bowel sounds present Extremities: No deformity; full range of motion Neurologic: Awake, alert and oriented; motor function intact in all extremities and symmetric; no facial droop Skin: Warm and dry Psychiatric: Normal mood and affect   RESULTS  Summary of this visit's results, reviewed by myself:   EKG Interpretation  Date/Time:    Ventricular Rate:    PR Interval:    QRS Duration:   QT Interval:    QTC Calculation:   R Axis:     Text Interpretation:        Laboratory Studies: Results for orders placed or performed during the hospital encounter of 02/12/18 (from the past 24 hour(s))  Group A Strep by PCR     Status: None   Collection Time: 02/11/18 11:01 PM  Result Value Ref Range   Group A Strep by PCR NOT DETECTED NOT DETECTED  Glucose, capillary     Status: Abnormal   Collection Time: 02/12/18 12:51 AM  Result Value Ref Range   Glucose-Capillary 123 (H) 70 - 99 mg/dL  Urinalysis, Routine w reflex microscopic     Status: Abnormal   Collection Time: 02/12/18 12:54 AM  Result Value Ref Range   Color, Urine  STRAW (A) YELLOW   APPearance CLEAR CLEAR   Specific Gravity, Urine 1.002 (L) 1.005 - 1.030   pH 6.0 5.0 - 8.0   Glucose, UA NEGATIVE NEGATIVE mg/dL   Hgb urine dipstick MODERATE (A) NEGATIVE   Bilirubin Urine NEGATIVE NEGATIVE   Ketones, ur NEGATIVE NEGATIVE mg/dL   Protein, ur NEGATIVE NEGATIVE mg/dL   Nitrite NEGATIVE NEGATIVE   Leukocytes, UA TRACE (A) NEGATIVE   RBC / HPF 0-5 0 - 5 RBC/hpf   WBC, UA 0-5 0 - 5 WBC/hpf   Bacteria, UA NONE SEEN NONE SEEN   Squamous Epithelial / LPF 0-5 0 - 5   Imaging Studies: Dg Chest 2 View  Result Date:  02/11/2018 CLINICAL DATA:  Flu like symptoms. General body aches. Right-sided chest pain. EXAM: CHEST - 2 VIEW COMPARISON:  March 23, 2012 FINDINGS: The heart size and mediastinal contours are within normal limits. Both lungs are clear. The visualized skeletal structures are unremarkable. IMPRESSION: No active cardiopulmonary disease. Electronically Signed   By: Gerome Samavid  Williams III M.D   On: 02/11/2018 23:09    ED COURSE and MDM  Nursing notes and initial vitals signs, including pulse oximetry, reviewed.  Vitals:   02/11/18 2252 02/11/18 2254  BP: 100/66   Pulse: 84   Resp: 16   Temp: 98.5 F (36.9 C)   TempSrc: Oral   SpO2: 97%   Weight:  77.1 kg  Height:  5\' 8"  (1.727 m)   1:55 AM Air movement improved after albuterol inhaler treatment.  Patient has some faint expiratory rhonchi in the bases.  He was encouraged to use this albuterol inhaler to keep his lungs open.  He was advised he may continue the over-the-counter cough/cold medications.  PROCEDURES    ED DIAGNOSES     ICD-10-CM   1. Influenza-like illness R69        Paula LibraMolpus, Elandra Powell, MD 02/12/18 [phone]

## 2018-10-08 ENCOUNTER — Encounter (HOSPITAL_COMMUNITY): Payer: Self-pay

## 2018-10-08 ENCOUNTER — Emergency Department (HOSPITAL_COMMUNITY)
Admission: EM | Admit: 2018-10-08 | Discharge: 2018-10-08 | Disposition: A | Discharge status: Home / Self Care | Payer: Self-pay | Attending: Emergency Medicine | Admitting: Emergency Medicine

## 2018-10-08 ENCOUNTER — Other Ambulatory Visit: Payer: Self-pay

## 2018-10-08 ENCOUNTER — Emergency Department (HOSPITAL_COMMUNITY): Payer: Self-pay

## 2018-10-08 DIAGNOSIS — Y929 Unspecified place or not applicable: Secondary | ICD-10-CM | POA: Insufficient documentation

## 2018-10-08 DIAGNOSIS — Y9389 Activity, other specified: Secondary | ICD-10-CM | POA: Insufficient documentation

## 2018-10-08 DIAGNOSIS — F17210 Nicotine dependence, cigarettes, uncomplicated: Secondary | ICD-10-CM | POA: Insufficient documentation

## 2018-10-08 DIAGNOSIS — S61411A Laceration without foreign body of right hand, initial encounter: Secondary | ICD-10-CM | POA: Insufficient documentation

## 2018-10-08 DIAGNOSIS — Y999 Unspecified external cause status: Secondary | ICD-10-CM | POA: Insufficient documentation

## 2018-10-08 DIAGNOSIS — W228XXA Striking against or struck by other objects, initial encounter: Secondary | ICD-10-CM | POA: Insufficient documentation

## 2018-10-08 MED ORDER — LIDOCAINE HCL (PF) 1 % IJ SOLN
5.0000 mL | Freq: Once | INTRAMUSCULAR | Status: AC
  Administered 2018-10-08: 5 mL
  Filled 2018-10-08: qty 30

## 2018-10-08 NOTE — ED Provider Notes (Signed)
[facility]-EMERGENCY DEPT Provider Note   CSN: [id number] Arrival date & time: 10/08/18  1813    History   Chief Complaint Chief Complaint  Patient presents with  . Laceration    Right Hand     HPI Darius Bailey is a 33 y.o. male.     HPI Patient presents with a hand laceration.  States he threw a dish rack on the floor and ended up with a clot in the hand.  States he feels a part of it is numb.  Hurts to move it.  States he does have a high pain threshold however.  States his last tetanus was within 10 years. Past Medical History:  Diagnosis Date  . Bronchitis     There are no active problems to display for this patient.   Past Surgical History:  Procedure Laterality Date  . NO PAST SURGERIES    . PERCUTANEOUS PINNING  07/15/2011   Procedure: PERCUTANEOUS PINNING EXTREMITY;  Surgeon: Marlowe ShoresMatthew A Weingold, MD;  Location: [facility] OR;  Service: Orthopedics;  Laterality: Right;  right ring and small metacarpal        Home Medications    Prior to Admission medications   Medication Sig Start Date End Date Taking? Authorizing Provider  promethazine (PHENERGAN) 25 MG tablet Take 1 tablet (25 mg total) by mouth every 6 (six) hours as needed for nausea or vomiting. 02/12/18   Molpus, John, MD    Family History Family History  Problem Relation Age of Onset  . Anesthesia problems Neg Hx     Social History Social History   Tobacco Use  . Smoking status: Current Every Day Smoker    Packs/day: 0.25    Types: Cigarettes  . Smokeless tobacco: Never Used  Substance Use Topics  . Alcohol use: Yes    Comment: 5 shots  . Drug use: Yes    Frequency: 1.0 times per week    Types: Marijuana     Allergies   Penicillins   Review of Systems Review of Systems  Constitutional: Negative for appetite change and fever.  Skin: Positive for wound.  Neurological: Positive for numbness. Negative for weakness.     Physical Exam Updated Vital Signs BP  128/80 (BP Location: Left Arm)   Pulse 68   Temp 99.1 F (37.3 C) (Oral)   Resp 18   SpO2 99%   Physical Exam Vitals signs and nursing note reviewed.  Constitutional:      Appearance: Normal appearance.  Musculoskeletal:     Comments: Approximate 2 cm laceration along the palmar aspect of the right hand.  It is between the thenar and the palmar area.  Good flexion and extension of all 5 fingers.  Sensation grossly intact distally but may have some mild paresthesias over the second finger.  Strong radial pulse.  Skin:    Capillary Refill: Capillary refill takes less than 2 seconds.  Neurological:     Mental Status: He is alert and oriented to person, place, and time.      ED Treatments / Results  Labs (all labs ordered are listed, but only abnormal results are displayed) Labs Reviewed - No data to display  EKG None  Radiology Dg Hand Complete Right  Result Date: 10/08/2018 CLINICAL DATA:  Laceration EXAM: RIGHT HAND - COMPLETE 3+ VIEW COMPARISON:  07/12/2011 FINDINGS: There is no evidence of fracture or dislocation. There is no evidence of arthropathy or other focal bone abnormality. Soft tissues are unremarkable. IMPRESSION: Negative. Electronically  Signed   By: Donavan Foil M.D.   On: 10/08/2018 21:08    Procedures .Marland KitchenLaceration Repair  Date/Time: 10/08/2018 9:34 PM Performed by: Davonna Belling, MD Authorized by: Davonna Belling, MD   Consent:    Consent obtained:  Verbal   Consent given by:  Patient   Risks discussed:  Infection, pain, need for additional repair, tendon damage, vascular damage, poor wound healing, nerve damage and poor cosmetic result   Alternatives discussed:  No treatment, delayed treatment and observation Anesthesia (see MAR for exact dosages):    Anesthesia method:  Local infiltration   Local anesthetic:  Lidocaine 1% w/o epi Laceration details:    Location:  Hand   Hand location:  R palm   Length (cm):  2 Repair type:    Repair type:   Intermediate Pre-procedure details:    Preparation:  Patient was prepped and draped in usual sterile fashion and imaging obtained to evaluate for foreign bodies Exploration:    Hemostasis achieved with:  Direct pressure   Wound exploration: wound explored through full range of motion and entire depth of wound probed and visualized     Wound extent: no nerve damage noted, no tendon damage noted and no underlying fracture noted     Contaminated: no   Treatment:    Area cleansed with:  Saline   Amount of cleaning:  Standard Subcutaneous repair:    Suture size:  5-0   Suture material:  Vicryl   Suture technique:  Simple interrupted   Number of sutures:  1 Skin repair:    Repair method:  Sutures   Suture size:  4-0   Suture material:  Prolene   Suture technique:  Simple interrupted   Number of sutures:  6 Approximation:    Approximation:  Close Post-procedure details:    Dressing:  Sterile dressing   Patient tolerance of procedure:  Tolerated well, no immediate complications   (including critical care time)  Medications Ordered in ED Medications  lidocaine (PF) (XYLOCAINE) 1 % injection 5 mL (5 mLs Infiltration Given 10/08/18 2035)     Initial Impression / Assessment and Plan / ED Course  I have reviewed the triage vital signs and the nursing notes.  Pertinent labs & imaging results that were available during my care of the patient were reviewed by me and considered in my medical decision making (see chart for details).        Patient with laceration over the palm on the right hand.  Numbed and explored without any tendon laceration seen.  Fingers explored through full range of motion.  Wound closed.  Sutures out in around 10 days.  Final Clinical Impressions(s) / ED Diagnoses   Final diagnoses:  Laceration of right hand without foreign body, initial encounter    ED Discharge Orders    None       Davonna Belling, MD 10/08/18 2135

## 2018-10-08 NOTE — Discharge Instructions (Addendum)
Have the stitches taken out in around 10 days. ?

## 2018-10-08 NOTE — ED Triage Notes (Addendum)
Patient states he got mad and threw a dish on the floor. Patient has 1 inch laceration to right palm of hand.   Bleeding is controlled.   7/10 pain     A/Ox4 Ambulatory in triage.    Temp-99.1 patient states he has been outside.

## 2018-12-30 ENCOUNTER — Emergency Department (HOSPITAL_COMMUNITY): Payer: Self-pay

## 2018-12-30 ENCOUNTER — Other Ambulatory Visit: Payer: Self-pay

## 2018-12-30 ENCOUNTER — Encounter (HOSPITAL_COMMUNITY): Payer: Self-pay | Admitting: Emergency Medicine

## 2018-12-30 ENCOUNTER — Emergency Department (HOSPITAL_COMMUNITY)
Admission: EM | Admit: 2018-12-30 | Discharge: 2018-12-31 | Disposition: A | Discharge status: Home / Self Care | Payer: Self-pay | Attending: Emergency Medicine | Admitting: Emergency Medicine

## 2018-12-30 DIAGNOSIS — Z20828 Contact with and (suspected) exposure to other viral communicable diseases: Secondary | ICD-10-CM | POA: Insufficient documentation

## 2018-12-30 DIAGNOSIS — Z20822 Contact with and (suspected) exposure to covid-19: Secondary | ICD-10-CM

## 2018-12-30 DIAGNOSIS — R05 Cough: Secondary | ICD-10-CM | POA: Insufficient documentation

## 2018-12-30 DIAGNOSIS — M791 Myalgia, unspecified site: Secondary | ICD-10-CM | POA: Insufficient documentation

## 2018-12-30 DIAGNOSIS — F17210 Nicotine dependence, cigarettes, uncomplicated: Secondary | ICD-10-CM | POA: Insufficient documentation

## 2018-12-30 DIAGNOSIS — F1210 Cannabis abuse, uncomplicated: Secondary | ICD-10-CM | POA: Insufficient documentation

## 2018-12-30 MED ORDER — BENZONATATE 100 MG PO CAPS: 200.0000 mg | ORAL_CAPSULE | Freq: Three times a day (TID) | ORAL | 0 refills | Status: DC

## 2018-12-30 MED ORDER — ALBUTEROL SULFATE HFA 108 (90 BASE) MCG/ACT IN AERS
2.0000 | INHALATION_SPRAY | Freq: Once | RESPIRATORY_TRACT | Status: AC
  Administered 2018-12-30: 2 via RESPIRATORY_TRACT
  Filled 2018-12-30: qty 6.7

## 2018-12-30 MED ORDER — AEROCHAMBER PLUS FLO-VU MEDIUM MISC
1.0000 | Freq: Once | Status: AC
  Administered 2018-12-30: 1
  Filled 2018-12-30: qty 1

## 2018-12-30 MED ORDER — ALBUTEROL SULFATE HFA 108 (90 BASE) MCG/ACT IN AERS: 1.0000 | INHALATION_SPRAY | Freq: Four times a day (QID) | RESPIRATORY_TRACT | 1 refills | Status: DC | PRN

## 2018-12-30 NOTE — ED Triage Notes (Signed)
Pt concerned for having COVID due to having cough, body aches, diarrhea, and fever. Pt reports having symptoms since Thursday.

## 2018-12-30 NOTE — ED Provider Notes (Signed)
[facility]-EMERGENCY DEPT Provider Note   CSN: [id number] Arrival date & time: 12/30/18  2135     History   Chief Complaint Chief Complaint  Patient presents with  . Generalized Body Aches  . Cough    HPI Darius Bailey is a 33 y.o. male with a past medical history of bronchitis who presents to ED for concern for COVID-19 infection.  States that he has had loss of taste and smell, body aches, intermittent cough and fever.  Reports sick contacts with similar symptoms.  Symptoms have been going on for the past 4 days.  He had an outpatient cover testing yesterday but was told that the results would come back in 3 to 4 days.  Patient states that he had similar symptoms in December 2019 was convinced that he had the COVID-19 infection and then.  Denies any chest pain, shortness of breath but he is concerned due to his ongoing history of bronchitis.     HPI  Past Medical History:  Diagnosis Date  . Bronchitis     There are no active problems to display for this patient.   Past Surgical History:  Procedure Laterality Date  . NO PAST SURGERIES    . PERCUTANEOUS PINNING  07/15/2011   Procedure: PERCUTANEOUS PINNING EXTREMITY;  Surgeon: Marlowe ShoresMatthew A Weingold, MD;  Location: [facility] OR;  Service: Orthopedics;  Laterality: Right;  right ring and small metacarpal        Home Medications    Prior to Admission medications   Medication Sig Start Date End Date Taking? Authorizing Provider  albuterol (VENTOLIN HFA) 108 (90 Base) MCG/ACT inhaler Inhale 1-2 puffs into the lungs every 6 (six) hours as needed for wheezing or shortness of breath. 12/30/18   Lucinda Spells, PA-C  benzonatate (TESSALON) 100 MG capsule Take 2 capsules (200 mg total) by mouth every 8 (eight) hours. 12/30/18   Zamarah Ullmer, PA-C  promethazine (PHENERGAN) 25 MG tablet Take 1 tablet (25 mg total) by mouth every 6 (six) hours as needed for nausea or vomiting. 02/12/18   Molpus, John, MD    Family  History Family History  Problem Relation Age of Onset  . Anesthesia problems Neg Hx     Social History Social History   Tobacco Use  . Smoking status: Current Every Day Smoker    Packs/day: 0.25    Types: Cigarettes  . Smokeless tobacco: Never Used  Substance Use Topics  . Alcohol use: Yes    Comment: 5 shots  . Drug use: Yes    Frequency: 1.0 times per week    Types: Marijuana     Allergies   Penicillins   Review of Systems Review of Systems  Constitutional: Positive for chills. Negative for fever.  HENT: Negative for congestion and hearing loss.   Respiratory: Positive for cough.   Gastrointestinal: Positive for nausea.  Musculoskeletal: Positive for myalgias.     Physical Exam Updated Vital Signs BP (!) 143/78 (BP Location: Left Arm)   Pulse 89   Temp 98.6 F (37 C) (Oral)   Resp 16   Ht 5\' 9"  (1.753 m)   Wt 84.4 kg   SpO2 96%   BMI 27.47 kg/m   Physical Exam Vitals signs and nursing note reviewed.  Constitutional:      General: He is not in acute distress.    Appearance: He is well-developed. He is not diaphoretic.  HENT:     Head: Normocephalic and atraumatic.  Eyes:  General: No scleral icterus.    Conjunctiva/sclera: Conjunctivae normal.  Neck:     Musculoskeletal: Normal range of motion.  Cardiovascular:     Rate and Rhythm: Normal rate and regular rhythm.     Heart sounds: Normal heart sounds.  Pulmonary:     Effort: Pulmonary effort is normal. No respiratory distress.     Breath sounds: Normal breath sounds.  Skin:    Findings: No rash.  Neurological:     Mental Status: He is alert.      ED Treatments / Results  Labs (all labs ordered are listed, but only abnormal results are displayed) Labs Reviewed - No data to display  EKG None  Radiology Dg Chest Portable 1 View  Result Date: 12/30/2018 CLINICAL DATA:  33 year old male with fever and cough EXAM: PORTABLE CHEST 1 VIEW COMPARISON:  Chest radiograph dated 02/11/2018  FINDINGS: The heart size and mediastinal contours are within normal limits. Both lungs are clear. The visualized skeletal structures are unremarkable. IMPRESSION: No active disease. Electronically Signed   By: Elgie Collard M.D.   On: 12/30/2018 23:13    Procedures Procedures (including critical care time)  Medications Ordered in ED Medications  albuterol (VENTOLIN HFA) 108 (90 Base) MCG/ACT inhaler 2 puff (2 puffs Inhalation Given 12/30/18 2337)  AeroChamber Plus Flo-Vu Medium MISC 1 each (1 each Other Given 12/30/18 2337)     Initial Impression / Assessment and Plan / ED Course  I have reviewed the triage vital signs and the nursing notes.  Pertinent labs & imaging results that were available during my care of the patient were reviewed by me and considered in my medical decision making (see chart for details).        Darius Bailey was evaluated in Emergency Department on 12/30/18  for the symptoms described in the history of present illness. He/she was evaluated in the context of the global COVID-19 pandemic, which necessitated consideration that the patient might be at risk for infection with the SARS-CoV-2 virus that causes COVID-19. Institutional protocols and algorithms that pertain to the evaluation of patients at risk for COVID-19 are in a state of rapid change based on information released by regulatory bodies including the CDC and federal and state organizations. These policies and algorithms were followed during the patient's care in the ED. chest x-ray is unremarkable.  He has a pending COVID test at [facility].  In the meantime will advise symptomatic management and quarantine.  Patient is hemodynamically stable, in NAD, and able to ambulate in the ED. Evaluation does not show pathology that would require ongoing emergent intervention or inpatient treatment. I explained the diagnosis to the patient. Pain has been managed and has no complaints prior to discharge.  Patient is comfortable with above plan and is stable for discharge at this time. All questions were answered prior to disposition. Strict return precautions for returning to the ED were discussed. Encouraged follow up with PCP.   An After Visit Summary was printed and given to the patient.   Portions of this note were generated with Scientist, clinical (histocompatibility and immunogenetics). Dictation errors may occur despite best attempts at proofreading.   Final Clinical Impressions(s) / ED Diagnoses   Final diagnoses:  COVID-19 virus test result unknown    ED Discharge Orders         Ordered    benzonatate (TESSALON) 100 MG capsule  Every 8 hours     12/30/18 2333    albuterol (VENTOLIN HFA) 108 (90 Base)  MCG/ACT inhaler  Every 6 hours PRN     12/30/18 2333           Delia Heady, PA-C 12/30/18 2340    Tegeler, Gwenyth Allegra, MD 12/31/18 0001

## 2019-04-21 ENCOUNTER — Encounter (HOSPITAL_COMMUNITY): Payer: Self-pay | Admitting: Emergency Medicine

## 2019-04-21 ENCOUNTER — Emergency Department (HOSPITAL_COMMUNITY)
Admission: EM | Admit: 2019-04-21 | Discharge: 2019-04-21 | Disposition: A | Discharge status: Home / Self Care | Payer: Self-pay | Attending: Emergency Medicine | Admitting: Emergency Medicine

## 2019-04-21 ENCOUNTER — Other Ambulatory Visit: Payer: Self-pay

## 2019-04-21 DIAGNOSIS — F17210 Nicotine dependence, cigarettes, uncomplicated: Secondary | ICD-10-CM | POA: Insufficient documentation

## 2019-04-21 DIAGNOSIS — Z79899 Other long term (current) drug therapy: Secondary | ICD-10-CM | POA: Insufficient documentation

## 2019-04-21 DIAGNOSIS — K0889 Other specified disorders of teeth and supporting structures: Secondary | ICD-10-CM | POA: Insufficient documentation

## 2019-04-21 MED ORDER — HYDROCODONE-ACETAMINOPHEN 5-325 MG PO TABS
2.0000 | ORAL_TABLET | Freq: Once | ORAL | Status: AC
  Administered 2019-04-21: 20:00:00 2 via ORAL
  Filled 2019-04-21: qty 2

## 2019-04-21 MED ORDER — CHLORHEXIDINE GLUCONATE 0.12 % MT SOLN: 15.0000 mL | Freq: Two times a day (BID) | OROMUCOSAL | 0 refills | Status: DC

## 2019-04-21 MED ORDER — CLINDAMYCIN HCL 150 MG PO CAPS: 450.0000 mg | ORAL_CAPSULE | Freq: Three times a day (TID) | ORAL | 0 refills | Status: AC

## 2019-04-21 NOTE — ED Triage Notes (Signed)
C/o L sided dental pain upper and lower since yesterday.  States he had lower tooth pulled 2 months ago and upper tooth is cracked.

## 2019-04-21 NOTE — ED Provider Notes (Signed)
[facility] EMERGENCY DEPARTMENT Provider Note   CSN: [id number] Arrival date & time: 04/21/19  1836     History Chief Complaint  Patient presents with  . Dental Pain    Darius Bailey is a 34 y.o. male history of bronchitis otherwise healthy presents today for left upper and lower dental pain ongoing for 2-3 days.  He reports that he was seen at dental works 2-3 months ago and had multiple teeth pulled.  He reports that he had teeth pulled as they were "cracked and infected" and his pain had initially improved.  He denies any new injuries and reports pain began to return 2-3 days ago.  He describes a moderate intensity throbbing ache to his left upper and lower teeth constant worsened with chewing on that side and slightly improved with BC powder, pain is nonradiating.  Denies fever/chills, headache/vision changes, swelling, sore throat, difficulty swallowing, trismus, neck pain, nausea/vomiting, fall/injury or any additional concerns. HPI     Past Medical History:  Diagnosis Date  . Bronchitis     There are no problems to display for this patient.   Past Surgical History:  Procedure Laterality Date  . NO PAST SURGERIES    . PERCUTANEOUS PINNING  07/15/2011   Procedure: PERCUTANEOUS PINNING EXTREMITY;  Surgeon: Marlowe Shores, MD;  Location: [facility] OR;  Service: Orthopedics;  Laterality: Right;  right ring and small metacarpal       Family History  Problem Relation Age of Onset  . Anesthesia problems Neg Hx     Social History   Tobacco Use  . Smoking status: Current Every Day Smoker    Packs/day: 0.25    Types: Cigarettes  . Smokeless tobacco: Never Used  Substance Use Topics  . Alcohol use: Yes    Comment: 5 shots  . Drug use: Yes    Frequency: 1.0 times per week    Types: Marijuana    Home Medications Prior to Admission medications   Medication Sig Start Date End Date Taking? Authorizing Provider  albuterol (VENTOLIN HFA) 108 (90 Base)  MCG/ACT inhaler Inhale 1-2 puffs into the lungs every 6 (six) hours as needed for wheezing or shortness of breath. 12/30/18   Khatri, Hina, PA-C  benzonatate (TESSALON) 100 MG capsule Take 2 capsules (200 mg total) by mouth every 8 (eight) hours. 12/30/18   Khatri, Hina, PA-C  chlorhexidine (PERIDEX) 0.12 % solution Use as directed 15 mLs in the mouth or throat 2 (two) times daily. Rinse and spit. Do not swallow Peridex. 04/21/19   Harlene Salts A, PA-C  clindamycin (CLEOCIN) 150 MG capsule Take 3 capsules (450 mg total) by mouth 3 (three) times daily for 7 days. 04/21/19 04/28/19  Bill Salinas, PA-C  promethazine (PHENERGAN) 25 MG tablet Take 1 tablet (25 mg total) by mouth every 6 (six) hours as needed for nausea or vomiting. 02/12/18   Molpus, John, MD    Allergies    Penicillins  Review of Systems   Review of Systems Ten systems are reviewed and are negative for acute change except as noted in the HPI  Physical Exam Updated Vital Signs BP 120/85 (BP Location: Left Arm)   Pulse 72   Temp 98.4 F (36.9 C) (Oral)   Resp 19   SpO2 100%   Physical Exam Constitutional:      General: He is not in acute distress.    Appearance: Normal appearance. He is well-developed. He is not ill-appearing or diaphoretic.  HENT:  Head: Normocephalic and atraumatic.     Jaw: There is normal jaw occlusion. No trismus.     Right Ear: Tympanic membrane and external ear normal.     Left Ear: Tympanic membrane and external ear normal.     Nose: Nose normal. No rhinorrhea.     Right Sinus: No maxillary sinus tenderness.     Left Sinus: No maxillary sinus tenderness.     Mouth/Throat:      Comments: Poor dentition overall, multiple cracked and missing teeth.  Tenderness to percussion of teeth #13, 14, 19. - The patient has normal phonation and is in control of secretions. No stridor.  Midline uvula without edema. Soft palate rises symmetrically. No tonsillar erythema, swelling or exudates. Tongue  protrusion is normal, floor of mouth is soft. No trismus. No creptius on neck palpation. No gingival erythema or fluctuance noted. Mucus membranes moist. Eyes:     General: Vision grossly intact. Gaze aligned appropriately.     Extraocular Movements: Extraocular movements intact.     Pupils: Pupils are equal, round, and reactive to light.  Neck:     Trachea: Trachea and phonation normal. No tracheal tenderness or tracheal deviation.  Pulmonary:     Effort: Pulmonary effort is normal. No respiratory distress.  Abdominal:     General: There is no distension.     Palpations: Abdomen is soft.     Tenderness: There is no abdominal tenderness. There is no guarding or rebound.  Musculoskeletal:        General: Normal range of motion.     Cervical back: Full passive range of motion without pain, normal range of motion and neck supple. No rigidity.  Skin:    General: Skin is warm and dry.  Neurological:     Mental Status: He is alert.     GCS: GCS eye subscore is 4. GCS verbal subscore is 5. GCS motor subscore is 6.     Comments: Speech is clear and goal oriented, follows commands Major Cranial nerves without deficit, no facial droop Moves extremities without ataxia, coordination intact  Psychiatric:        Behavior: Behavior normal.    ED Results / Procedures / Treatments   Labs (all labs ordered are listed, but only abnormal results are displayed) Labs Reviewed - No data to display  EKG None  Radiology No results found.  Procedures Procedures (including critical care time)  Medications Ordered in ED Medications  HYDROcodone-acetaminophen (NORCO/VICODIN) 5-325 MG per tablet 2 tablet (has no administration in time range)    ED Course  I have reviewed the triage vital signs and the nursing notes.  Pertinent labs & imaging results that were available during my care of the patient were reviewed by me and considered in my medical decision making (see chart for details).    MDM  Rules/Calculators/A&P                      34 year old male presents today with left upper and lower dental pain ongoing for 2-3 days.  Multiple broken teeth and infected dental surface cavities noted.  No signs or symptoms of dental abscess, no swelling/erythema/tenderness of the gums.  Patient is well-appearing, afebrile, nontoxic, speaking well.  Patient able to swallow without pain.  No signs of swelling or concern for Ludwig's angina/Peritonsilar abscess/Retropharyngeal abscess or other deep space infections or emergent pathology requiring further work-up in the ER.  He has no sign of swelling of the neck, patient has  good range of motion of the neck, no trismus.  We will treat with clindamycin, anti-inflammatories and encouraged follow-up with dentist for definitive management.  He will be given referral to on-call dentist Dr. Mia Creek for follow-up.  Additionally he was given 2 pills Norco here for his dental pain, he has a ride home today and states understanding of narcotic precautions.  At this time there does not appear to be any evidence of an acute emergency medical condition and the patient appears stable for discharge with appropriate outpatient follow up. Diagnosis was discussed with patient who verbalizes understanding of care plan and is agreeable to discharge. I have discussed return precautions with patient who verbalizes understanding of return precautions. Patient encouraged to follow-up with their PCP and dentist. All questions answered. Patient has been discharged in good condition.   Note: Portions of this report may have been transcribed using voice recognition software. Every effort was made to ensure accuracy; however, inadvertent computerized transcription errors may still be present. Final Clinical Impression(s) / ED Diagnoses Final diagnoses:  Pain, dental    Rx / DC Orders ED Discharge Orders         Ordered    clindamycin (CLEOCIN) 150 MG capsule  3 times daily      04/21/19 1929    chlorhexidine (PERIDEX) 0.12 % solution  2 times daily     04/21/19 1929           Elizabeth Palau 04/21/19 1931    Charlynne Pander, MD 04/21/19 [phone]

## 2019-04-21 NOTE — ED Notes (Signed)
Discharge instructions reviewed with pt. Pt verbalized understanding.   

## 2019-04-21 NOTE — Discharge Instructions (Addendum)
You have been diagnosed today with Dental Pain.  At this time there does not appear to be the presence of an emergent medical condition, however there is always the potential for conditions to change. Please read and follow the below instructions.  Please return to the Emergency Department immediately for any new or worsening symptoms. Please be sure to follow up with your Primary Care Provider within one week regarding your visit today; please call their office to schedule an appointment even if you are feeling better for a follow-up visit. Please take the antibiotic clindamycin as prescribed to help with your symptoms. Please drink plenty of water and get plenty of rest. You had been given a pain medication today called Norco.  This medication may make you drowsy so do not drive or perform any potentially dangerous activities for the rest the day.  Do not drink alcohol or take any sedating medications for the rest the day as this will worsen side effects. You may use the Peridex mouth rinse to help with your symptoms.  Rinse and spit this medication out.  Do not swallow Peridex. You may take over-the-counter anti-inflammatory such as ibuprofen as directed on the packaging to help with your symptoms.  Get help right away if: You cannot open your mouth. You are having trouble breathing or swallowing. You have a fever. Your face, neck, or jaw is swollen. You have any new/concerning or worsening of symptoms Please read the additional information packets attached to your discharge summary.  Do not take your medicine if  develop an itchy rash, swelling in your mouth or lips, or difficulty breathing; call 911 and seek immediate emergency medical attention if this occurs.  Note: Portions of this text may have been transcribed using voice recognition software. Every effort was made to ensure accuracy; however, inadvertent computerized transcription errors may still be present.

## 2019-12-01 IMAGING — CR DG CHEST 2V
2 series · 2 of 2 positions shown · non-contrast
Comparison: March 23, 2012

CLINICAL DATA: Flu like symptoms. General body aches. Right-sided
chest pain.

EXAM:
CHEST - 2 VIEW

[w chest pa]
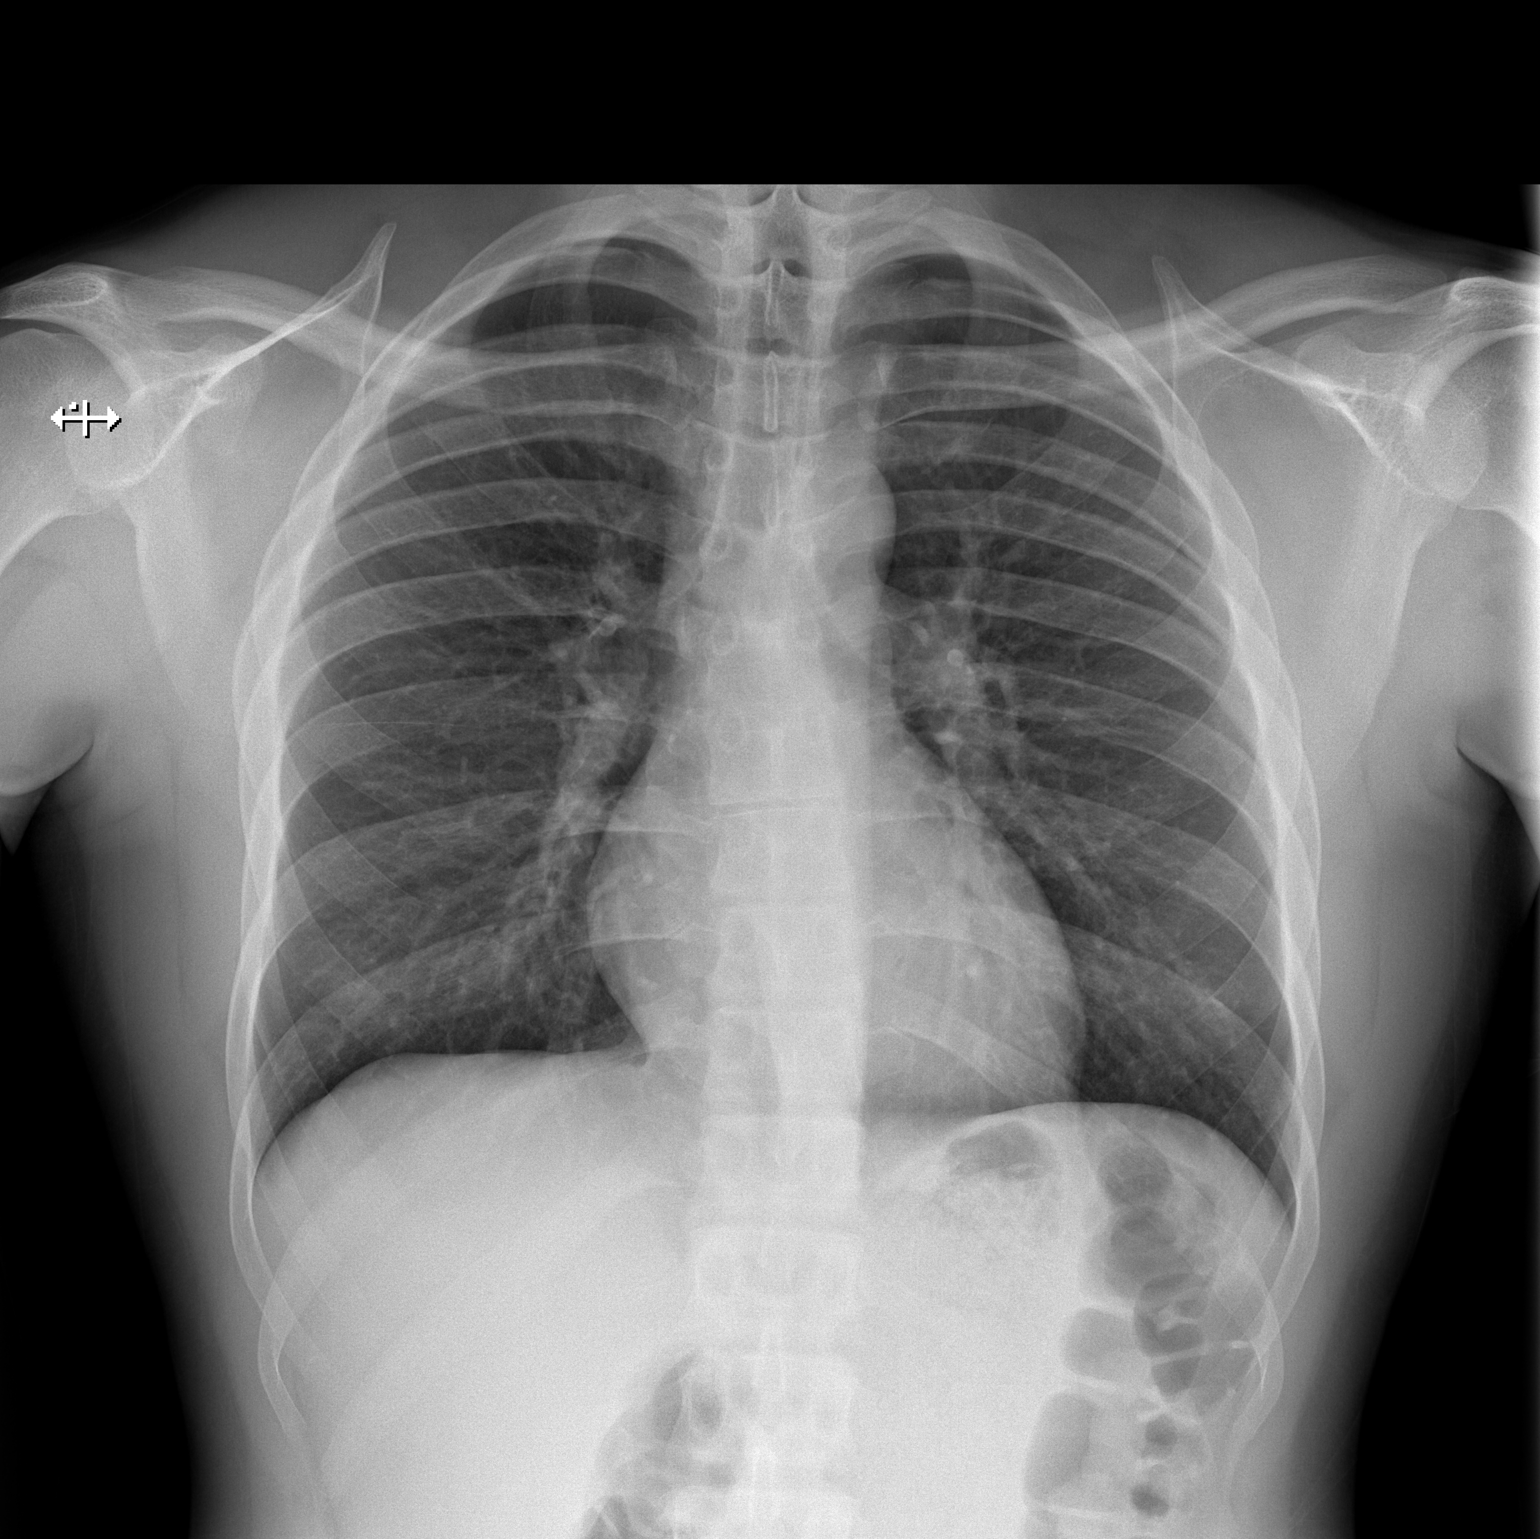

[w chest lat]
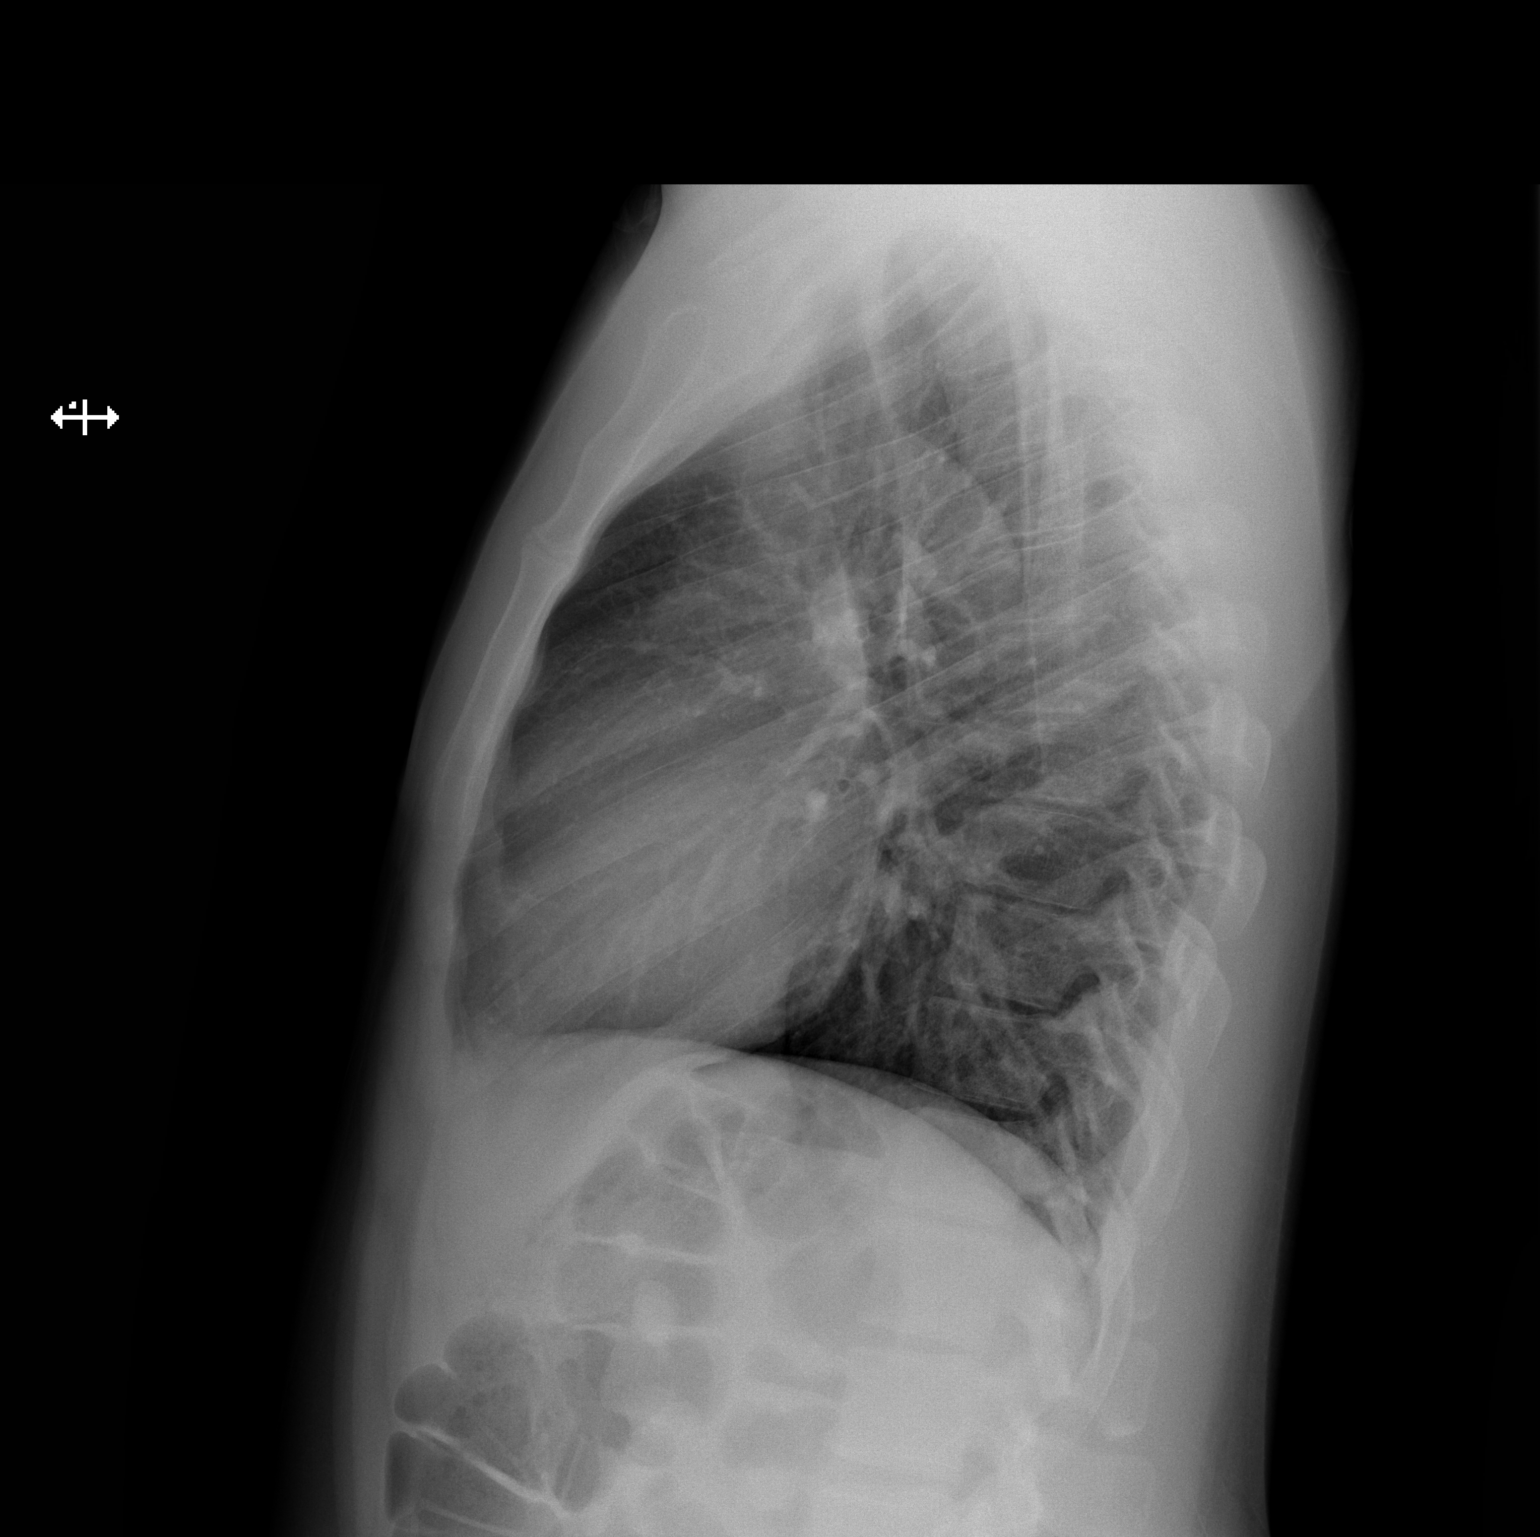

[2 of 2 positions shown; findings below may reference images not displayed]

FINDINGS: The heart size and mediastinal contours are within normal limits.
Both lungs are clear. The visualized skeletal structures are
unremarkable.
IMPRESSION: No active cardiopulmonary disease.

## 2020-03-07 ENCOUNTER — Other Ambulatory Visit: Payer: Self-pay

## 2020-03-07 ENCOUNTER — Encounter (HOSPITAL_COMMUNITY): Payer: Self-pay

## 2020-03-07 ENCOUNTER — Emergency Department (HOSPITAL_COMMUNITY)
Admission: EM | Admit: 2020-03-07 | Discharge: 2020-03-07 | Disposition: A | Discharge status: Home / Self Care | Payer: Self-pay | Attending: Emergency Medicine | Admitting: Emergency Medicine

## 2020-03-07 DIAGNOSIS — U071 COVID-19: Secondary | ICD-10-CM | POA: Insufficient documentation

## 2020-03-07 DIAGNOSIS — F17210 Nicotine dependence, cigarettes, uncomplicated: Secondary | ICD-10-CM | POA: Insufficient documentation

## 2020-03-07 LAB — RESP PANEL BY RT-PCR (FLU A&B, COVID) ARPGX2
Influenza A by PCR: NEGATIVE
Influenza B by PCR: NEGATIVE
SARS Coronavirus 2 by RT PCR: POSITIVE — AB

## 2020-03-07 MED ORDER — ACETAMINOPHEN 325 MG PO TABS
650.0000 mg | ORAL_TABLET | Freq: Once | ORAL | Status: AC | PRN
  Administered 2020-03-07: 650 mg via ORAL
  Filled 2020-03-07: qty 2

## 2020-03-07 NOTE — ED Provider Notes (Signed)
[facility] EMERGENCY DEPARTMENT Provider Note   CSN: [id number] Arrival date & time: 03/07/20  1046     History Chief Complaint  Patient presents with   Fever   Generalized Body Aches    Darius Bailey is a 34 y.o. male.  The history is provided by the patient.  Fever  Darius Bailey is a 34 y.o. male who presents to the Emergency Department complaining of fever and chills. He presents the emergency department complaining of fever and chills that began last night. Hx temperature was 103 oral. He reports mild associated headache. He denies any chest pain, shortness of breath, cough, sore throat, nausea, vomiting, diarrhea, dysuria, abdominal pain.  He does smoke tobacco. No alcohol or street drugs. He has not been vaccinated for COVID-19. 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 No known sick contacts.    Past Medical History:  Diagnosis Date   Bronchitis     There are no problems to display for this patient.   Past Surgical History:  Procedure Laterality Date   NO PAST SURGERIES     PERCUTANEOUS PINNING  07/15/2011   Procedure: PERCUTANEOUS PINNING EXTREMITY;  Surgeon: Marlowe Shores, MD;  Location: [facility] OR;  Service: Orthopedics;  Laterality: Right;  right ring and small metacarpal       Family History  Problem Relation Age of Onset   Anesthesia problems Neg Hx     Social History   Tobacco Use   Smoking status: Current Every Day Smoker    Packs/day: 0.25    Types: Cigarettes   Smokeless tobacco: Never Used  Substance Use Topics   Alcohol use: Yes    Comment: 5 shots   Drug use: Yes    Frequency: 1.0 times per week    Types: Marijuana    Home Medications Prior to Admission medications   Medication Sig Start Date End Date Taking? Authorizing Provider  albuterol (VENTOLIN HFA) 108 (90 Base) MCG/ACT inhaler Inhale 1-2 puffs into the lungs every 6 (six) hours as needed for  wheezing or shortness of breath. 12/30/18   Khatri, Hina, PA-C  benzonatate (TESSALON) 100 MG capsule Take 2 capsules (200 mg total) by mouth every 8 (eight) hours. 12/30/18   Khatri, Hina, PA-C  chlorhexidine (PERIDEX) 0.12 % solution Use as directed 15 mLs in the mouth or throat 2 (two) times daily. Rinse and spit. Do not swallow Peridex. 04/21/19   Bill Salinas, PA-C  promethazine (PHENERGAN) 25 MG tablet Take 1 tablet (25 mg total) by mouth every 6 (six) hours as needed for nausea or vomiting. 02/12/18   Molpus, John, MD    Allergies    Penicillins  Review of Systems   Review of Systems  Constitutional: Positive for fever.  All other systems reviewed and are negative.   Physical Exam Updated Vital Signs BP (!) 107/50    Pulse 78    Temp 100.2 F (37.9 C) (Oral)    Resp 18    Ht 5\' 7"  (1.702 m)    Wt 79.4 kg    SpO2 96%    BMI 27.41 kg/m   Physical Exam Vitals and nursing note reviewed.  Constitutional:      Appearance: He is well-developed and well-nourished.  HENT:     Head: Normocephalic and atraumatic.  Cardiovascular:     Rate and Rhythm: Normal rate and regular rhythm.     Heart  sounds: No murmur heard.   Pulmonary:     Effort: Pulmonary effort is normal. No respiratory distress.     Breath sounds: Normal breath sounds.  Abdominal:     Palpations: Abdomen is soft.     Tenderness: There is no abdominal tenderness. There is no guarding or rebound.  Musculoskeletal:        General: No swelling, tenderness or edema.     Cervical back: Neck supple.  Skin:    General: Skin is warm and dry.  Neurological:     Mental Status: He is alert and oriented to person, place, and time.  Psychiatric:        Mood and Affect: Mood and affect normal.        Behavior: Behavior normal.     ED Results / Procedures / Treatments   Labs (all labs ordered are listed, but only abnormal results are displayed) Labs Reviewed  RESP PANEL BY RT-PCR (FLU A&B, COVID) ARPGX2 - Abnormal;  Notable for the following components:      Result Value   SARS Coronavirus 2 by RT PCR POSITIVE (*)    All other components within normal limits    EKG None  Radiology No results found.  Procedures Procedures (including critical care time)  Medications Ordered in ED Medications  acetaminophen (TYLENOL) tablet 650 mg (650 mg Oral Given 03/07/20 1055)    ED Course  I have reviewed the triage vital signs and the nursing notes.  Pertinent labs & imaging results that were available during my care of the patient were reviewed by me and considered in my medical decision making (see chart for details).    MDM Rules/Calculators/A&P                         patient here for evaluation of fever, chills since yesterday. He is non-toxic appearing on evaluation with no acute distress. Presentation is not consistent with sepsis, serious bacterial infection, meningitis. He is positive for COVID-19 infection today. Discussed with patient home isolation, home care and return precautions for COVID-19 infection.  Darius Bailey was evaluated in Emergency Department on 03/07/2020 for the symptoms described in the history of present illness. He was evaluated in the context of the global COVID-19 pandemic, which necessitated consideration that the patient might be at risk for infection with the SARS-CoV-2 virus that causes COVID-19. Institutional protocols and algorithms that pertain to the evaluation of patients at risk for COVID-19 are in a state of rapid change based on information released by regulatory bodies including the CDC and federal and state organizations. These policies and algorithms were followed during the patient's care in the ED.  Final Clinical Impression(s) / ED Diagnoses Final diagnoses:  COVID-19 virus infection    Rx / DC Orders ED Discharge Orders    None       Tilden Fossa, MD 03/07/20 1215

## 2020-03-07 NOTE — ED Triage Notes (Signed)
Pt reports fever, chills and generalized body aches since last night. Pt has not had any COVID vaccines. Resp e.u

## 2020-03-08 ENCOUNTER — Telehealth: Payer: Self-pay | Admitting: Physician Assistant

## 2020-03-08 NOTE — Telephone Encounter (Signed)
Called to discuss with patient about Covid symptoms and the use of sotrovimab, bamlanivimab/etesevimab or casirivimab/imdevimab, a monoclonal antibody infusion for those with mild to moderate Covid symptoms and at a high risk of hospitalization.  Pt is qualified for this infusion at the [place] [facility] center due to; Specific high risk criteria : BMI > 25 and Other high risk medical condition per CDC:  high SVI   Message left to call back our hotline [phone]. My chart message sent if active on Mychart.   Dawne Casali PA-C  

## 2020-07-27 IMAGING — CR RIGHT HAND - COMPLETE 3+ VIEW
3 series · 3 of 3 positions shown · non-contrast
Comparison: 07/12/2011

CLINICAL DATA: Laceration

EXAM:
RIGHT HAND - COMPLETE 3+ VIEW

[x hand lat right]
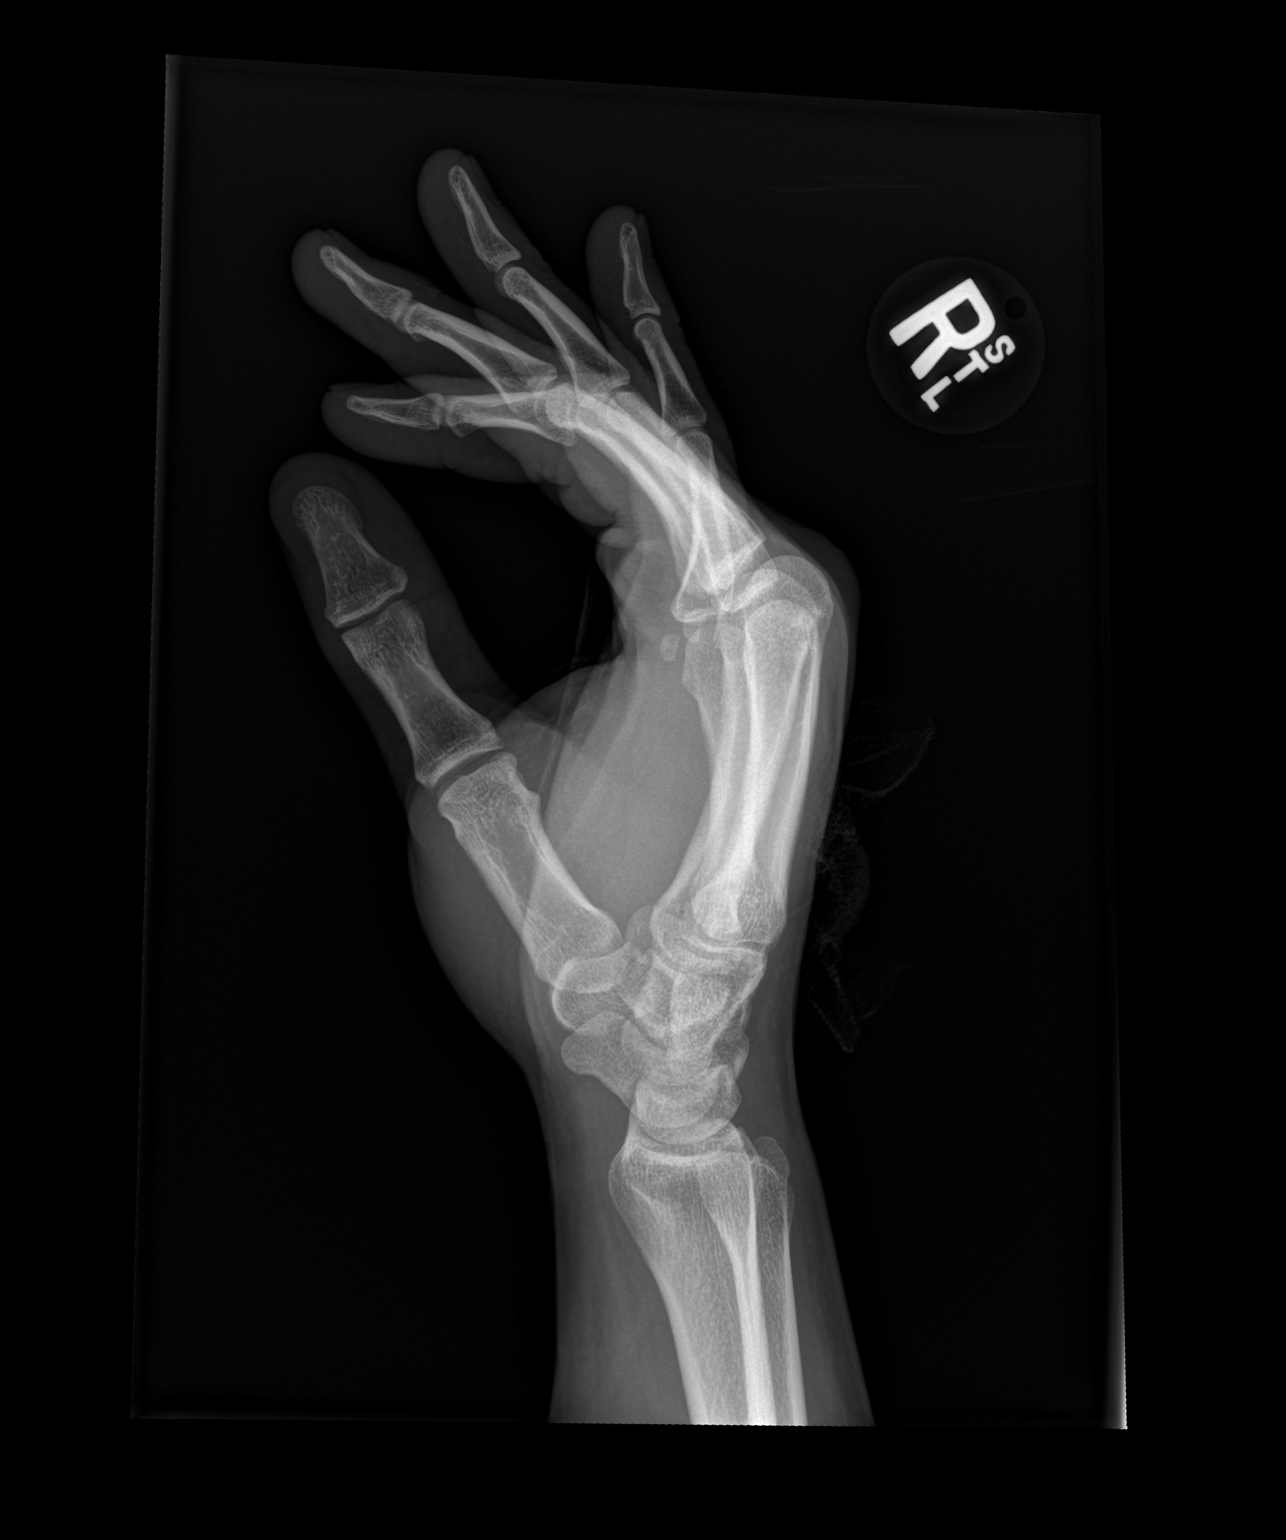

[x hand pa right]
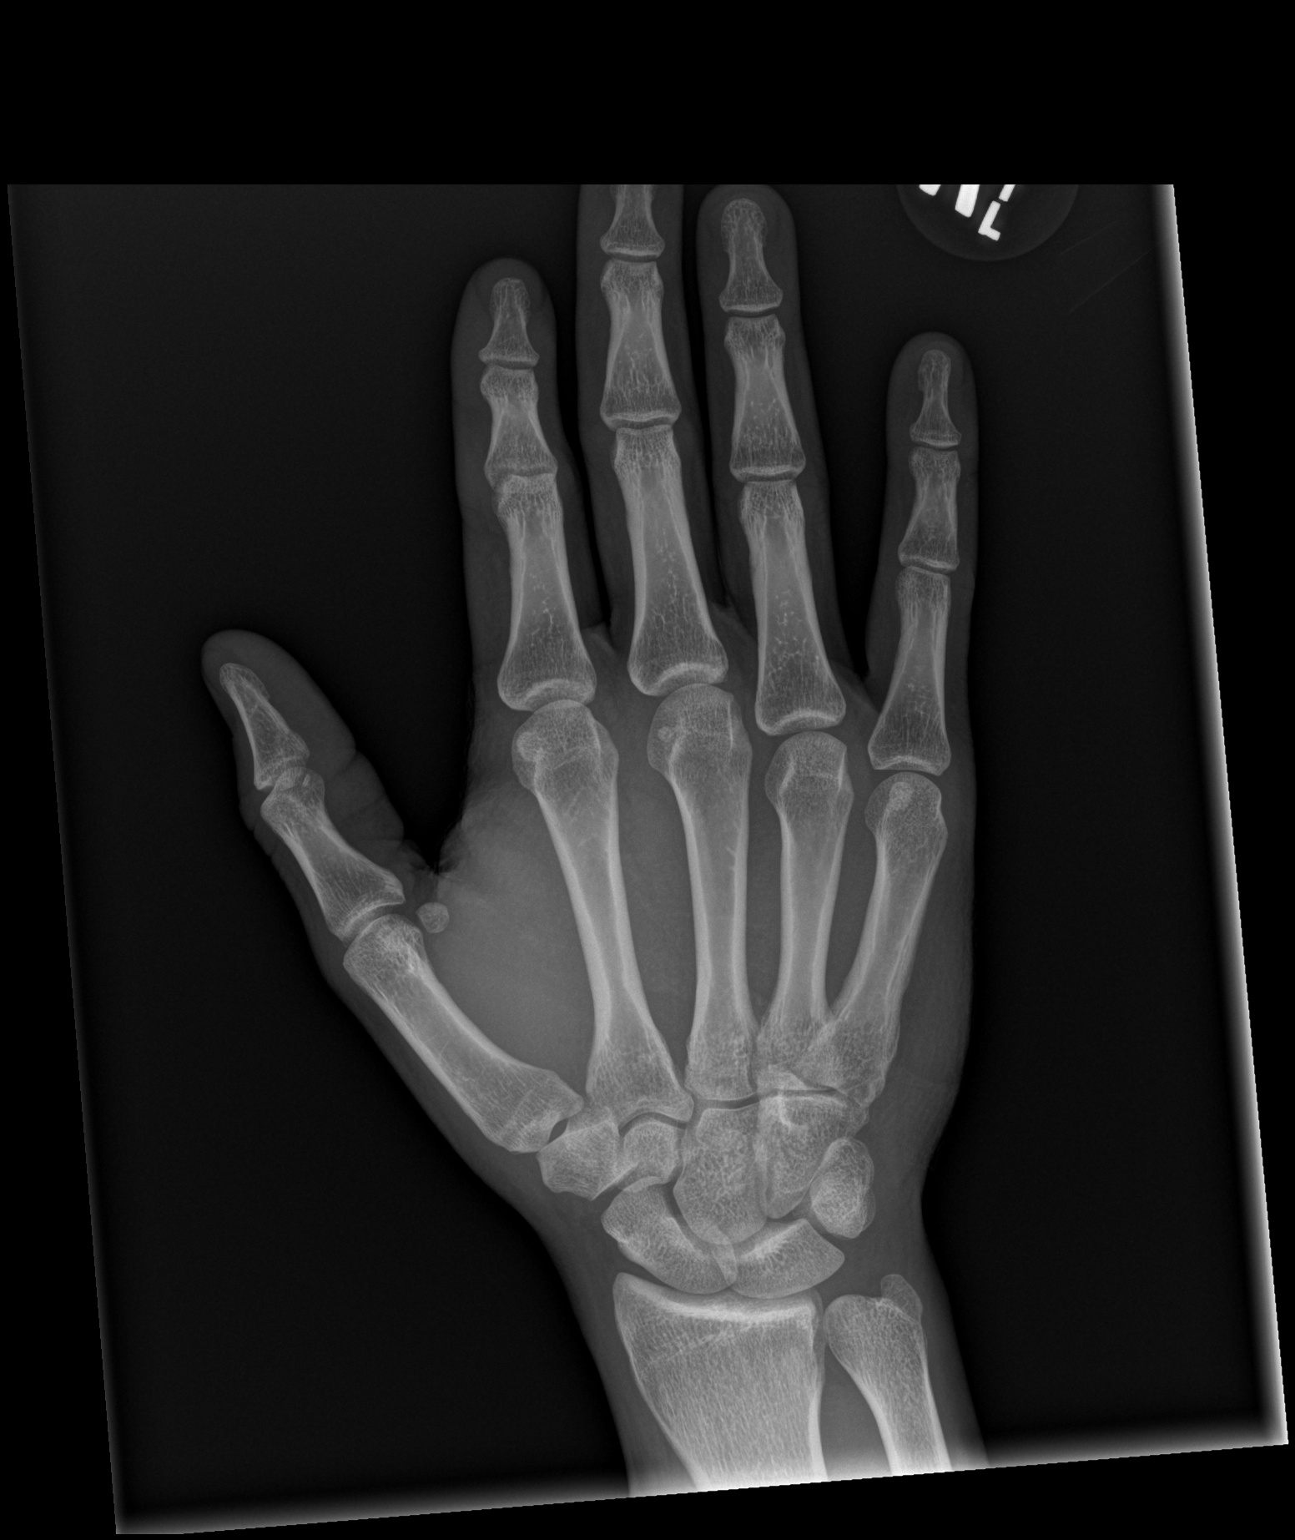

[x hand obl right]
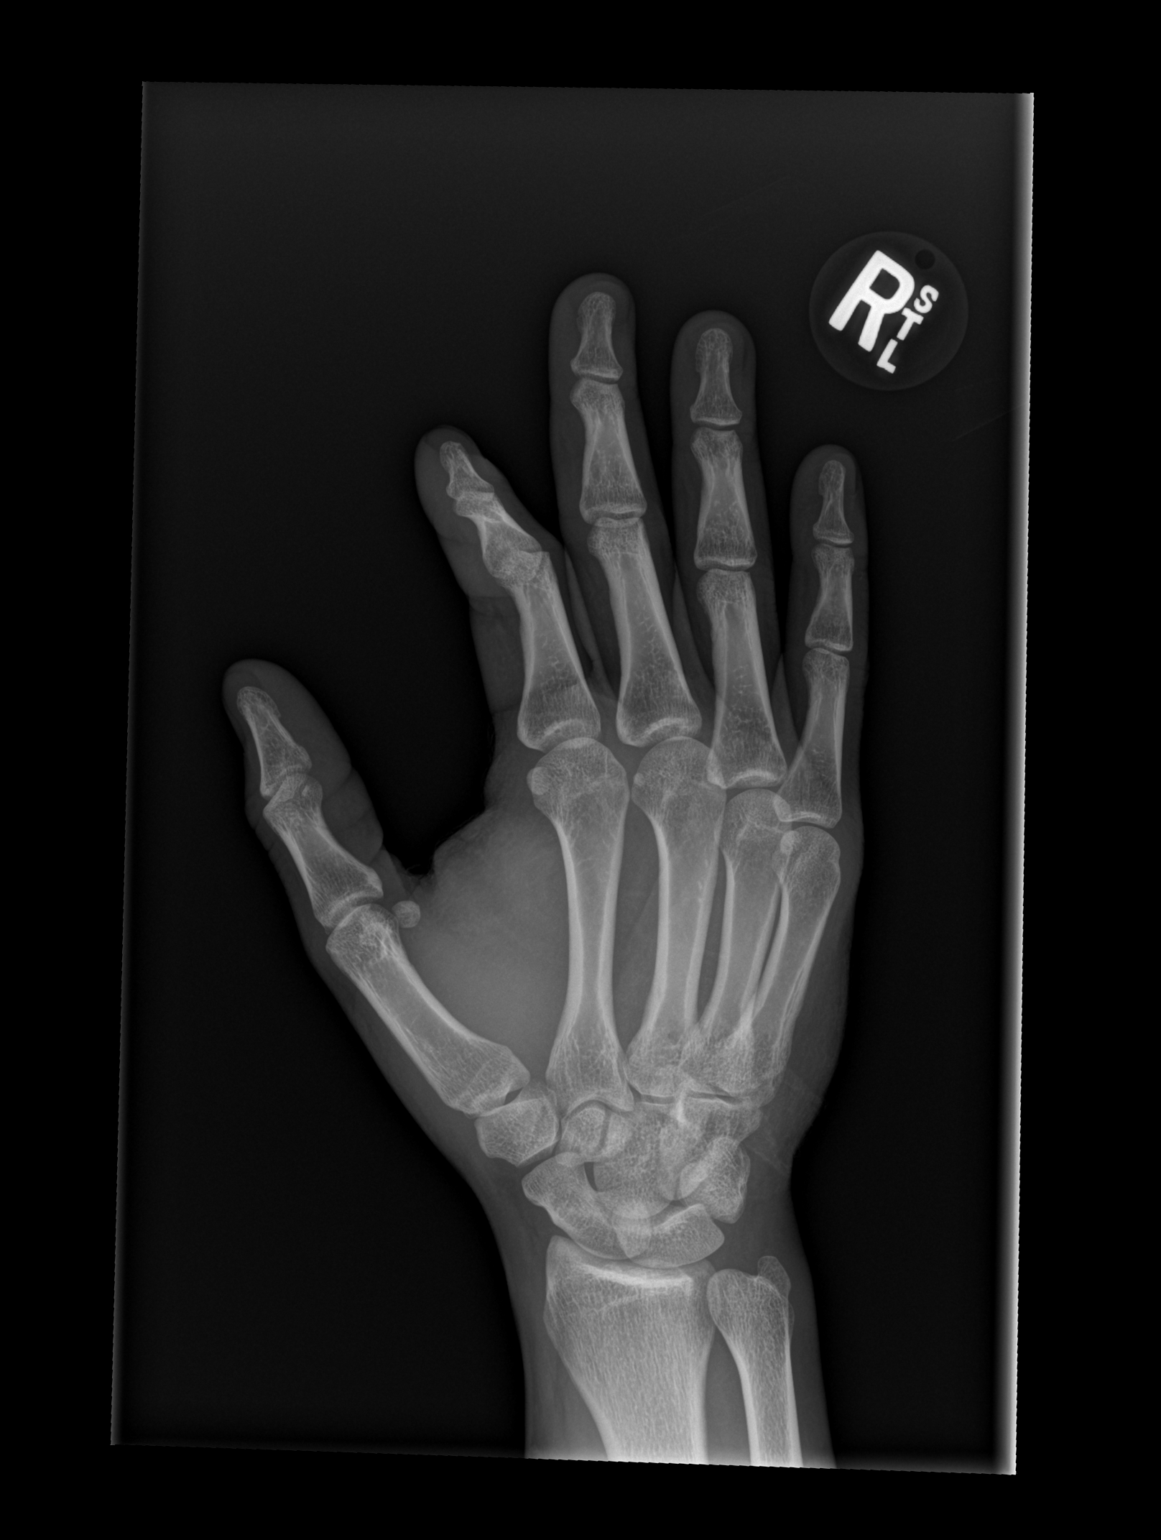

[3 of 3 positions shown; findings below may reference images not displayed]

FINDINGS: There is no evidence of fracture or dislocation. There is no
evidence of arthropathy or other focal bone abnormality. Soft
tissues are unremarkable.
IMPRESSION: Negative.

## 2020-09-18 ENCOUNTER — Emergency Department (HOSPITAL_COMMUNITY)
Admission: EM | Admit: 2020-09-18 | Discharge: 2020-09-18 | Disposition: A | Discharge status: Home / Self Care | Payer: Self-pay | Attending: Emergency Medicine | Admitting: Emergency Medicine

## 2020-09-18 ENCOUNTER — Encounter (HOSPITAL_COMMUNITY): Payer: Self-pay | Admitting: Emergency Medicine

## 2020-09-18 DIAGNOSIS — F17210 Nicotine dependence, cigarettes, uncomplicated: Secondary | ICD-10-CM | POA: Insufficient documentation

## 2020-09-18 DIAGNOSIS — R509 Fever, unspecified: Secondary | ICD-10-CM | POA: Insufficient documentation

## 2020-09-18 DIAGNOSIS — K0889 Other specified disorders of teeth and supporting structures: Secondary | ICD-10-CM

## 2020-09-18 DIAGNOSIS — K029 Dental caries, unspecified: Secondary | ICD-10-CM | POA: Insufficient documentation

## 2020-09-18 MED ORDER — IBUPROFEN 800 MG PO TABS: 800.0000 mg | ORAL_TABLET | Freq: Three times a day (TID) | ORAL | 0 refills | Status: AC

## 2020-09-18 MED ORDER — CLINDAMYCIN HCL 150 MG PO CAPS: 450.0000 mg | ORAL_CAPSULE | Freq: Three times a day (TID) | ORAL | 0 refills | Status: AC

## 2020-09-18 MED ORDER — KETOROLAC TROMETHAMINE 15 MG/ML IJ SOLN
15.0000 mg | Freq: Once | INTRAMUSCULAR | Status: AC
  Administered 2020-09-18: 15 mg via INTRAMUSCULAR
  Filled 2020-09-18: qty 1

## 2020-09-18 NOTE — ED Notes (Signed)
ED PA at [facility] 

## 2020-09-18 NOTE — ED Notes (Signed)
C/o L mandibular jaw pain. Endorses HA, swelling, and jaw pain. Onset last week. Gradually progressively worse. No palpable abscess. No obvious tenderness. Reports dentist removed 1-3 left lower molar 1 year ago.

## 2020-09-18 NOTE — ED Triage Notes (Signed)
Patient complains of abscess on left jaw that he first noticed several days ago, states the pain and swelling has gotten worse today. Patient alert, oriented, and in no apparent distress at this time.

## 2020-09-18 NOTE — Discharge Instructions (Addendum)
I have provided antibiotics for your dental infection.Please take 3 capsules three times a day for the next 7 days.If you experience any fever, difficulty swallowing, voice changes or shortness of breath please return to the ED for reevaluation. I have also prescribed pain medication, please take for severe pain as needed. You may follow up with your dentist after completion of antibiotic therapy.  ° °

## 2020-09-18 NOTE — ED Provider Notes (Signed)
Emergency Medicine Provider Triage Evaluation Note  Darius Bailey , a 35 y.o. male  was evaluated in triage.  Pt complains of dental pain for 1 year.  States its been worse over the past couple days.  Denies difficulty opening closing his mouth.  Review of Systems  Positive: Dental pain Negative: Fever  Physical Exam  BP 121/76 (BP Location: Right Arm)   Pulse 61   Temp 98.4 F (36.9 C) (Oral)   Resp 18   SpO2 100%  Gen:   Awake, no distress   Resp:  Normal effort  MSK:   Moves extremities without difficulty  Other:  Poor dentition throughout.  No trismus.  Left lower dentition overloaded.  Medical Decision Making  Medically screening exam initiated at 2:35 PM.  Appropriate orders placed.  Darius Bailey was informed that the remainder of the evaluation will be completed by another provider, this initial triage assessment does not replace that evaluation, and the importance of remaining in the ED until their evaluation is complete.     Darius Bailey, [place] 09/18/20 1442    Wynetta Fines, MD 09/19/20 [phone]

## 2020-09-18 NOTE — ED Provider Notes (Signed)
[facility] EMERGENCY DEPARTMENT Provider Note   CSN: [id number] Arrival date & time: 09/18/20  1330     History Chief Complaint  Patient presents with   Abscess    Darius Bailey is a 35 y.o. male.  35 year old male with a past medical history of bronchitis presents to the ED with a chief complaint of left facial swelling, dental problem for the past several weeks.  Patient reports he had previous work done on his teeth approximately a year ago, but he feels that there was residual teeth that was not removed.  He reports the pain has been waxing and waning, however he was concerned as the pain was spreading into his jaw.  He is tolerating his secretions, however he does feel pain with palpation along the left jaw.  He has been taking Goody powders along with placing them on his gumline for relief.  Reports a subjective fever, on arrival afebrile to the ED.  No trismus, no changes in phonation, no sore throat.  The history is provided by the patient.  Abscess Associated symptoms: no fever       Past Medical History:  Diagnosis Date   Bronchitis     There are no problems to display for this patient.   Past Surgical History:  Procedure Laterality Date   NO PAST SURGERIES     PERCUTANEOUS PINNING  07/15/2011   Procedure: PERCUTANEOUS PINNING EXTREMITY;  Surgeon: Marlowe Shores, MD;  Location: [facility] OR;  Service: Orthopedics;  Laterality: Right;  right ring and small metacarpal       Family History  Problem Relation Age of Onset   Anesthesia problems Neg Hx     Social History   Tobacco Use   Smoking status: Every Day    Packs/day: 0.25    Pack years: 0.00    Types: Cigarettes   Smokeless tobacco: Never  Substance Use Topics   Alcohol use: Yes    Comment: 5 shots   Drug use: Yes    Frequency: 1.0 times per week    Types: Marijuana    Home Medications Prior to Admission medications   Medication Sig Start Date End Date Taking? Authorizing  Provider  clindamycin (CLEOCIN) 150 MG capsule Take 3 capsules (450 mg total) by mouth 3 (three) times daily for 7 days. 09/18/20 09/25/20 Yes Vaishnavi Dalby, PA-C  ibuprofen (ADVIL) 800 MG tablet Take 1 tablet (800 mg total) by mouth 3 (three) times daily for 7 days. 09/18/20 09/25/20 Yes Lanyiah Brix, PA-C  albuterol (VENTOLIN HFA) 108 (90 Base) MCG/ACT inhaler Inhale 1-2 puffs into the lungs every 6 (six) hours as needed for wheezing or shortness of breath. 12/30/18   Khatri, Hina, PA-C  benzonatate (TESSALON) 100 MG capsule Take 2 capsules (200 mg total) by mouth every 8 (eight) hours. 12/30/18   Khatri, Hina, PA-C  chlorhexidine (PERIDEX) 0.12 % solution Use as directed 15 mLs in the mouth or throat 2 (two) times daily. Rinse and spit. Do not swallow Peridex. 04/21/19   Bill Salinas, PA-C  promethazine (PHENERGAN) 25 MG tablet Take 1 tablet (25 mg total) by mouth every 6 (six) hours as needed for nausea or vomiting. 02/12/18   Molpus, John, MD    Allergies    Penicillins  Review of Systems   Review of Systems  Constitutional:  Negative for chills and fever.  HENT:  Positive for dental problem.    Physical Exam Updated Vital Signs BP 121/76 (BP Location: Right Arm)  Pulse 61   Temp 98.4 F (36.9 C) (Oral)   Resp 18   SpO2 100%   Physical Exam Vitals and nursing note reviewed.  Constitutional:      Appearance: Normal appearance.  HENT:     Head: Normocephalic and atraumatic.     Nose: Nose normal.     Mouth/Throat:     Mouth: Mucous membranes are moist.     Dentition: Abnormal dentition. Dental tenderness and dental caries present. No dental abscesses.     Pharynx: No oropharyngeal exudate or posterior oropharyngeal erythema.     Tonsils: No tonsillar exudate or tonsillar abscesses.     Comments: Oropharynx without any erythema.  No tonsillar exudates, no PTA noted.  Poor dentition throughout, palpable abscess in the posterior aspect of the left gumline. Eyes:     Pupils:  Pupils are equal, round, and reactive to light.  Cardiovascular:     Rate and Rhythm: Normal rate.  Pulmonary:     Effort: Pulmonary effort is normal.     Breath sounds: No wheezing or rales.  Abdominal:     General: Abdomen is flat.  Musculoskeletal:     Cervical back: Normal range of motion and neck supple.  Skin:    General: Skin is warm and dry.  Neurological:     Mental Status: He is alert and oriented to person, place, and time.    ED Results / Procedures / Treatments   Labs (all labs ordered are listed, but only abnormal results are displayed) Labs Reviewed - No data to display  EKG None  Radiology No results found.  Procedures Procedures   Medications Ordered in ED Medications  ketorolac (TORADOL) 15 MG/ML injection 15 mg (has no administration in time range)    ED Course  I have reviewed the triage vital signs and the nursing notes.  Pertinent labs & imaging results that were available during my care of the patient were reviewed by me and considered in my medical decision making (see chart for details).    MDM Rules/Calculators/A&P  Patient presents to the ED with a chief complaint of dental pain for several weeks.  Prior dental work approximately a year ago, where he had his teeth extracted, he feels that there was residual tooth that had broken off.  He has been having consistent pain to the left lower aspect of his mouth with radiation up to his jaw.  Has been taking Goody powders with some improvement.  Last dental appointment was about a year ago.  Does report a subjective fever, although afebrile on arrival to the ED.  Oropharynx is clear without erythema, no tonsillar exudates noted, no PTA.  Phonation is normal.  No voice changes, dentition is poor throughout, multiple missing teeth to the posterior left gumline.  We discussed antibiotic therapy along with pain relief anti-inflammatory given in the ED such as Toradol.  Patient's vitals are within normal  limits, he understands and agrees with management.  He will also go home on a short course of ibuprofen to help with pain control along with clindamycin to help treat his dental infection.  Patient understands and agrees to management, return precautions discussed at length.  Patient stable for discharge.   Portions of this note were generated with Scientist, clinical (histocompatibility and immunogenetics). Dictation errors may occur despite best attempts at proofreading.  Final Clinical Impression(s) / ED Diagnoses Final diagnoses:  Pain, dental    Rx / DC Orders ED Discharge Orders  Ordered    clindamycin (CLEOCIN) 150 MG capsule  3 times daily        09/18/20 1741    ibuprofen (ADVIL) 800 MG tablet  3 times daily        09/18/20 1741             Claude Manges, PA-C 09/18/20 1743    Tegeler, Canary Brim, MD 09/19/20 [phone]

## 2020-11-16 ENCOUNTER — Encounter (HOSPITAL_COMMUNITY): Payer: Self-pay

## 2020-11-16 ENCOUNTER — Other Ambulatory Visit: Payer: Self-pay

## 2020-11-16 ENCOUNTER — Emergency Department (HOSPITAL_COMMUNITY)
Admission: EM | Admit: 2020-11-16 | Discharge: 2020-11-16 | Disposition: A | Discharge status: Home / Self Care | Payer: Self-pay | Attending: Emergency Medicine | Admitting: Emergency Medicine

## 2020-11-16 DIAGNOSIS — Z20822 Contact with and (suspected) exposure to covid-19: Secondary | ICD-10-CM

## 2020-11-16 DIAGNOSIS — U071 COVID-19: Secondary | ICD-10-CM | POA: Insufficient documentation

## 2020-11-16 DIAGNOSIS — Z28310 Unvaccinated for covid-19: Secondary | ICD-10-CM | POA: Insufficient documentation

## 2020-11-16 DIAGNOSIS — F17210 Nicotine dependence, cigarettes, uncomplicated: Secondary | ICD-10-CM | POA: Insufficient documentation

## 2020-11-16 DIAGNOSIS — Z8616 Personal history of COVID-19: Secondary | ICD-10-CM | POA: Insufficient documentation

## 2020-11-16 LAB — SARS CORONAVIRUS 2 (TAT 6-24 HRS): SARS Coronavirus 2: POSITIVE — AB

## 2020-11-16 NOTE — Discharge Instructions (Addendum)
As we discussed even if your test comes back negative I would suspect it is a false negative and you need to isolate yourself appropriately. When you recover please consider getting vaccinated.  If you develop chest pain, swelling in 1 leg, have significant shortness of breath, or have any other concerns please seek additional medical care and evaluation.  Please take Ibuprofen (Advil, motrin) and Tylenol (acetaminophen) to relieve your pain.    You may take up to 600 MG (3 pills) of normal strength ibuprofen every 8 hours as needed.   You make take tylenol, up to 1,000 mg (two extra strength pills) every 8 hours as needed.   It is safe to take ibuprofen and tylenol at the same time as they work differently.   Do not take more than 3,000 mg tylenol in a 24 hour period (not more than one dose every 8 hours.  Please check all medication labels as many medications such as pain and cold medications may contain tylenol.  Do not drink alcohol while taking these medications.  Do not take other NSAID'S while taking ibuprofen (such as aleve or naproxen).  Please take ibuprofen with food to decrease stomach upset.

## 2020-11-16 NOTE — ED Triage Notes (Signed)
Pt reports generalized body aches, chills, fatigue since yesterday. Requesting a covid test

## 2020-11-16 NOTE — ED Provider Notes (Signed)
[facility] EMERGENCY DEPARTMENT Provider Note   CSN: [id number] Arrival date & time: 11/16/20  1402     History Chief Complaint  Patient presents with   Generalized Body Aches   Fatigue    Darius Bailey is a 35 y.o. male with no pertinent past medical history presents today for evaluation of body aches, chills, fatigue.  His symptoms started yesterday.  He had COVID last December and states that this feels similar.  He is not vaccinated.  He denies any known sick contacts.  He reports that he had fever, and he took Tylenol with improvement in his symptoms.  He denies any chest pain, leg swelling or shortness of breath.  Does report nonproductive cough.  He states that he did not have any COVID test at home and he is primarily here to get a COVID test.  HPI     Past Medical History:  Diagnosis Date   Bronchitis     There are no problems to display for this patient.   Past Surgical History:  Procedure Laterality Date   NO PAST SURGERIES     PERCUTANEOUS PINNING  07/15/2011   Procedure: PERCUTANEOUS PINNING EXTREMITY;  Surgeon: Marlowe Shores, MD;  Location: [facility] OR;  Service: Orthopedics;  Laterality: Right;  right ring and small metacarpal       Family History  Problem Relation Age of Onset   Anesthesia problems Neg Hx     Social History   Tobacco Use   Smoking status: Every Day    Packs/day: 0.25    Types: Cigarettes   Smokeless tobacco: Never  Substance Use Topics   Alcohol use: Yes    Comment: 5 shots   Drug use: Yes    Frequency: 1.0 times per week    Types: Marijuana    Home Medications Prior to Admission medications   Medication Sig Start Date End Date Taking? Authorizing Provider  albuterol (VENTOLIN HFA) 108 (90 Base) MCG/ACT inhaler Inhale 1-2 puffs into the lungs every 6 (six) hours as needed for wheezing or shortness of breath. 12/30/18   Khatri, Hina, PA-C  benzonatate (TESSALON) 100 MG capsule Take 2 capsules (200 mg  total) by mouth every 8 (eight) hours. 12/30/18   Khatri, Hina, PA-C  chlorhexidine (PERIDEX) 0.12 % solution Use as directed 15 mLs in the mouth or throat 2 (two) times daily. Rinse and spit. Do not swallow Peridex. 04/21/19   Bill Salinas, PA-C  promethazine (PHENERGAN) 25 MG tablet Take 1 tablet (25 mg total) by mouth every 6 (six) hours as needed for nausea or vomiting. 02/12/18   Molpus, John, MD    Allergies    Penicillins  Review of Systems   Review of Systems  Constitutional:  Positive for chills, fatigue and fever.  HENT:  Positive for congestion.   Respiratory:  Positive for cough. Negative for chest tightness and shortness of breath.   Cardiovascular:  Negative for chest pain and leg swelling.  Gastrointestinal:  Negative for abdominal pain.  Musculoskeletal:  Positive for arthralgias and myalgias.  Skin:  Negative for color change and rash.  Neurological:  Negative for weakness and headaches.  All other systems reviewed and are negative.  Physical Exam Updated Vital Signs BP 107/64 (BP Location: Right Arm)   Pulse 60   Temp 98.7 F (37.1 C) (Oral)   Resp 15   Ht 5\' 8"  (1.727 m)   Wt 83 kg   SpO2 100%   BMI 27.83 kg/m  Physical Exam Vitals and nursing note reviewed.  Constitutional:      General: He is not in acute distress.    Appearance: He is not ill-appearing.     Comments: Well-appearing  HENT:     Head: Atraumatic.  Eyes:     Conjunctiva/sclera: Conjunctivae normal.  Cardiovascular:     Rate and Rhythm: Normal rate and regular rhythm.     Heart sounds: Normal heart sounds.  Pulmonary:     Effort: Pulmonary effort is normal. No respiratory distress.     Breath sounds: Normal breath sounds. No wheezing or rhonchi.  Abdominal:     General: There is no distension.  Musculoskeletal:     Cervical back: Normal range of motion and neck supple.     Comments: No obvious acute injury  Skin:    General: Skin is warm.  Neurological:     Mental Status:  He is alert.     Comments: Awake and alert, answers all questions appropriately.  Speech is not slurred.  Psychiatric:        Mood and Affect: Mood normal.        Behavior: Behavior normal.     ED Results / Procedures / Treatments   Labs (all labs ordered are listed, but only abnormal results are displayed) Labs Reviewed  SARS CORONAVIRUS 2 (TAT 6-24 HRS)    EKG None  Radiology No results found.  Procedures Procedures   Medications Ordered in ED Medications - No data to display  ED Course  I have reviewed the triage vital signs and the nursing notes.  Pertinent labs & imaging results that were available during my care of the patient were reviewed by me and considered in my medical decision making (see chart for details).    MDM Rules/Calculators/A&P                          Darius Bailey was evaluated in Emergency Department on 11/16/2020 for the symptoms described in the history of present illness. He was evaluated in the context of the global COVID-19 pandemic, which necessitated consideration that the patient might be at risk for infection with the SARS-CoV-2 virus that causes COVID-19. Institutional protocols and algorithms that pertain to the evaluation of patients at risk for COVID-19 are in a state of rapid change based on information released by regulatory bodies including the CDC and federal and state organizations. These policies and algorithms were followed during the patient's care in the ED.  Patient is an otherwise healthy 35 year old man who presents today for evaluation of about 24 hours of cough, myalgias/arthralgias,, chills and fatigue.  He states this feels similar to when he had COVID last December.  He is not vaccinated.  He denies chest pain, significant shortness of breath or unilateral leg swelling.  No rashes. Clinically suspect COVID-19.  He is afebrile, not tachycardic or tachypneic, and 100% on room air.  He does not have any chest pain or  shortness of breath, no indication for x-ray. Will obtain COVID test.  Discussed conservative care, isolation, and return precautions.  Return precautions were discussed with patient who states their understanding.  At the time of discharge patient denied any unaddressed complaints or concerns.  Patient is agreeable for discharge home.  Note: Portions of this report may have been transcribed using voice recognition software. Every effort was made to ensure accuracy; however, inadvertent computerized transcription errors may be present    Final Clinical Impression(s) /  ED Diagnoses Final diagnoses:  Suspected COVID-19 virus infection    Rx / DC Orders ED Discharge Orders     None        Norman Clay 11/16/20 1427    Benjiman Core, MD 11/17/20 [phone]

## 2021-01-17 ENCOUNTER — Other Ambulatory Visit: Payer: Self-pay

## 2021-01-17 ENCOUNTER — Emergency Department (HOSPITAL_COMMUNITY)
Admission: EM | Admit: 2021-01-17 | Discharge: 2021-01-17 | Disposition: A | Discharge status: Home / Self Care | Payer: Self-pay | Attending: Emergency Medicine | Admitting: Emergency Medicine

## 2021-01-17 ENCOUNTER — Encounter (HOSPITAL_COMMUNITY): Payer: Self-pay | Admitting: Emergency Medicine

## 2021-01-17 DIAGNOSIS — F17210 Nicotine dependence, cigarettes, uncomplicated: Secondary | ICD-10-CM | POA: Insufficient documentation

## 2021-01-17 DIAGNOSIS — J101 Influenza due to other identified influenza virus with other respiratory manifestations: Secondary | ICD-10-CM | POA: Insufficient documentation

## 2021-01-17 DIAGNOSIS — Z20822 Contact with and (suspected) exposure to covid-19: Secondary | ICD-10-CM | POA: Insufficient documentation

## 2021-01-17 LAB — RESP PANEL BY RT-PCR (FLU A&B, COVID) ARPGX2
Influenza A by PCR: POSITIVE — AB
Influenza B by PCR: NEGATIVE
SARS Coronavirus 2 by RT PCR: NEGATIVE

## 2021-01-17 MED ORDER — ACETAMINOPHEN 325 MG PO TABS
650.0000 mg | ORAL_TABLET | Freq: Once | ORAL | Status: AC
  Administered 2021-01-17: 650 mg via ORAL
  Filled 2021-01-17: qty 2

## 2021-01-17 MED ORDER — BENZONATATE 100 MG PO CAPS: 100.0000 mg | ORAL_CAPSULE | Freq: Three times a day (TID) | ORAL | 0 refills | Status: DC | PRN

## 2021-01-17 NOTE — ED Triage Notes (Signed)
C/o body aches, chills, fever, and cough that started today.  Took Ibuprofen a couple hours ago.

## 2021-01-17 NOTE — ED Provider Notes (Signed)
[facility] EMERGENCY DEPARTMENT Provider Note   CSN: [id number] Arrival date & time: 01/17/21  1142     History No chief complaint on file.   Darius Bailey is a 35 y.o. male.  HPI Patient is a 35 year old male who presents to the emergency department due to fevers, chills, body aches, headaches, cough that started earlier today.  No nausea, vomiting, diarrhea, chest pain, shortness of breath.  Patient states that he has been vaccinated for the flu.    Past Medical History:  Diagnosis Date   Bronchitis     There are no problems to display for this patient.   Past Surgical History:  Procedure Laterality Date   NO PAST SURGERIES     PERCUTANEOUS PINNING  07/15/2011   Procedure: PERCUTANEOUS PINNING EXTREMITY;  Surgeon: Marlowe Shores, MD;  Location: [facility] OR;  Service: Orthopedics;  Laterality: Right;  right ring and small metacarpal       Family History  Problem Relation Age of Onset   Anesthesia problems Neg Hx     Social History   Tobacco Use   Smoking status: Every Day    Packs/day: 0.25    Types: Cigarettes   Smokeless tobacco: Never  Substance Use Topics   Alcohol use: Yes    Comment: 5 shots   Drug use: Yes    Frequency: 1.0 times per week    Types: Marijuana    Home Medications Prior to Admission medications   Medication Sig Start Date End Date Taking? Authorizing Provider  benzonatate (TESSALON) 100 MG capsule Take 1 capsule (100 mg total) by mouth 3 (three) times daily as needed for cough. 01/17/21  Yes Placido Sou, PA-C  albuterol (VENTOLIN HFA) 108 (90 Base) MCG/ACT inhaler Inhale 1-2 puffs into the lungs every 6 (six) hours as needed for wheezing or shortness of breath. 12/30/18   Khatri, Hina, PA-C  chlorhexidine (PERIDEX) 0.12 % solution Use as directed 15 mLs in the mouth or throat 2 (two) times daily. Rinse and spit. Do not swallow Peridex. 04/21/19   Bill Salinas, PA-C  promethazine (PHENERGAN) 25 MG tablet Take  1 tablet (25 mg total) by mouth every 6 (six) hours as needed for nausea or vomiting. 02/12/18   Molpus, Jonny Ruiz, MD    Allergies    Penicillins  Review of Systems   Review of Systems  All other systems reviewed and are negative. Ten systems reviewed and are negative for acute change, except as noted in the HPI.   Physical Exam Updated Vital Signs BP 108/67 (BP Location: Left Arm)   Pulse 89   Temp (!) 100.6 F (38.1 C) (Oral)   Resp 16   SpO2 96%   Physical Exam Vitals and nursing note reviewed.  Constitutional:      General: He is not in acute distress.    Appearance: Normal appearance. He is not ill-appearing, toxic-appearing or diaphoretic.  HENT:     Head: Normocephalic and atraumatic.     Right Ear: External ear normal.     Left Ear: External ear normal.     Nose: Nose normal.     Mouth/Throat:     Mouth: Mucous membranes are moist.     Pharynx: Oropharynx is clear. No oropharyngeal exudate or posterior oropharyngeal erythema.  Eyes:     General: No scleral icterus.       Right eye: No discharge.        Left eye: No discharge.     Extraocular  Movements: Extraocular movements intact.     Conjunctiva/sclera: Conjunctivae normal.  Cardiovascular:     Rate and Rhythm: Normal rate and regular rhythm.     Pulses: Normal pulses.     Heart sounds: Normal heart sounds. No murmur heard.   No friction rub. No gallop.  Pulmonary:     Effort: Pulmonary effort is normal. No respiratory distress.     Breath sounds: Normal breath sounds. No stridor. No wheezing, rhonchi or rales.  Abdominal:     General: Abdomen is flat.     Tenderness: There is no abdominal tenderness.  Musculoskeletal:        General: Normal range of motion.     Cervical back: Normal range of motion and neck supple. No tenderness.  Skin:    General: Skin is warm and dry.  Neurological:     General: No focal deficit present.     Mental Status: He is alert and oriented to person, place, and time.   Psychiatric:        Mood and Affect: Mood normal.        Behavior: Behavior normal.    ED Results / Procedures / Treatments   Labs (all labs ordered are listed, but only abnormal results are displayed) Labs Reviewed  RESP PANEL BY RT-PCR (FLU A&B, COVID) ARPGX2 - Abnormal; Notable for the following components:      Result Value   Influenza A by PCR POSITIVE (*)    All other components within normal limits    EKG None  Radiology No results found.  Procedures Procedures   Medications Ordered in ED Medications  acetaminophen (TYLENOL) tablet 650 mg (650 mg Oral Given 01/17/21 1219)    ED Course  I have reviewed the triage vital signs and the nursing notes.  Pertinent labs & imaging results that were available during my care of the patient were reviewed by me and considered in my medical decision making (see chart for details).    MDM Rules/Calculators/A&P                          Patient is a 35 year old nontoxic-appearing male who presents to the emergency department with what appears to be symptoms associated with flu A.  Respiratory panel obtained in triage.  Physical exam is generally reassuring.  Patient with a fever of 100.6 F.  Tachycardia appears to have resolved after being given antipyretics.  Patient denying any nausea, vomiting, diarrhea, chest pain, or shortness of breath.  Heart is regular rate and rhythm.  Lungs are clear to auscultation bilaterally.  Abdomen is flat, soft, and nontender.  He has been vaccinated for the flu.  Feel that he is stable for discharge at this time and he is agreeable.  Discussed prescribing him Tamiflu given his symptoms began earlier today but he declined.  Will discharge on a course of Tessalon Perles.  We discussed return precautions.  His questions were answered and he was amicable at the time of discharge.  Final Clinical Impression(s) / ED Diagnoses Final diagnoses:  Influenza A   Rx / DC Orders ED Discharge Orders           Ordered    benzonatate (TESSALON) 100 MG capsule  3 times daily PRN        01/17/21 1936             Placido Sou, PA-C 01/17/21 1946    Wynetta Fines, MD 01/17/21 2314

## 2021-01-17 NOTE — ED Notes (Signed)
E-signature pad unavailable at time of pt discharge. This RN discussed discharge materials with pt and answered all pt questions. Pt stated understanding of discharge material. ? ?

## 2021-01-17 NOTE — ED Provider Notes (Signed)
Emergency Medicine Provider Triage Evaluation Note  CHAYDEN GARRELTS , a 35 y.o. male  was evaluated in triage.  Pt complains of flulike symptoms that began in the middle of the night.  He complains of body aches, cough, fevers, chills.  He is unsure if he has been around anyone has been sick.  He states he has had COVID 3 times and this feels slightly similar however he is having sinus pressure which is new.  He did receive his flu vaccine this year.  He last took ibuprofen around 1030 this morning for his fever.  Continues to be febrile at 101.6 here.  Review of Systems  Positive: + cough, fever, chills, body aches Negative: - sore throat  Physical Exam  BP 126/76 (BP Location: Right Arm)   Pulse (!) 131   Temp (!) 101.6 F (38.7 C) (Oral)   Resp 18   SpO2 99%  Gen:   Awake, no distress   Resp:  Normal effort  MSK:   Moves extremities without difficulty  Other:  LCTAB  Medical Decision Making  Medically screening exam initiated at 12:15 PM.  Appropriate orders placed.  Cebastian L Vizcarrondo was informed that the remainder of the evaluation will be completed by another provider, this initial triage assessment does not replace that evaluation, and the importance of remaining in the ED until their evaluation is complete.     Tanda Rockers, PA-C 01/17/21 1216    Rolan Bucco, MD 01/17/21 1444

## 2021-01-17 NOTE — Discharge Instructions (Addendum)
I prescribed you a cough medication called Tessalon Perles.  You can take these up to 3 times a day for your cough.  I would recommend that you continue taking Motrin as well as Tylenol for management of your fevers.  Please rotate these 2 medications and follow the instructions on the bottles.  If you develop any new or worsening symptoms please come back to the emergency department.

## 2021-02-07 ENCOUNTER — Emergency Department (HOSPITAL_COMMUNITY)
Admission: EM | Admit: 2021-02-07 | Discharge: 2021-02-08 | Disposition: A | Discharge status: Home / Self Care | Payer: Self-pay | Attending: Emergency Medicine | Admitting: Emergency Medicine

## 2021-02-07 DIAGNOSIS — T59891A Toxic effect of other specified gases, fumes and vapors, accidental (unintentional), initial encounter: Secondary | ICD-10-CM | POA: Insufficient documentation

## 2021-02-07 DIAGNOSIS — Y990 Civilian activity done for income or pay: Secondary | ICD-10-CM | POA: Insufficient documentation

## 2021-02-07 DIAGNOSIS — F17210 Nicotine dependence, cigarettes, uncomplicated: Secondary | ICD-10-CM | POA: Insufficient documentation

## 2021-02-07 DIAGNOSIS — J680 Bronchitis and pneumonitis due to chemicals, gases, fumes and vapors: Secondary | ICD-10-CM | POA: Insufficient documentation

## 2021-02-08 ENCOUNTER — Other Ambulatory Visit: Payer: Self-pay

## 2021-02-08 ENCOUNTER — Encounter (HOSPITAL_COMMUNITY): Payer: Self-pay | Admitting: *Deleted

## 2021-02-08 ENCOUNTER — Emergency Department (HOSPITAL_COMMUNITY): Payer: Self-pay

## 2021-02-08 MED ORDER — METHYLPREDNISOLONE 4 MG PO TBPK: ORAL_TABLET | ORAL | 0 refills | Status: DC

## 2021-02-08 MED ORDER — ALBUTEROL SULFATE HFA 108 (90 BASE) MCG/ACT IN AERS
2.0000 | INHALATION_SPRAY | Freq: Four times a day (QID) | RESPIRATORY_TRACT | Status: DC
  Administered 2021-02-08: 10:00:00 2 via RESPIRATORY_TRACT
  Filled 2021-02-08: qty 6.7

## 2021-02-08 MED ORDER — PREDNISONE 20 MG PO TABS
50.0000 mg | ORAL_TABLET | ORAL | Status: AC
  Administered 2021-02-08: 10:00:00 50 mg via ORAL
  Filled 2021-02-08: qty 3

## 2021-02-08 NOTE — ED Notes (Signed)
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

## 2021-02-08 NOTE — Discharge Instructions (Addendum)
Please use the provided albuterol for relief from coughing and obtain and use the prescribed steroid medication for the next few days.  If he develop new, or concerning changes return here otherwise follow-up with your physician as needed.  Most importantly, please avoid any potential new exposure to fumes.

## 2021-02-08 NOTE — ED Triage Notes (Signed)
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

## 2021-02-08 NOTE — ED Provider Notes (Addendum)
[facility] EMERGENCY DEPARTMENT Provider Note   CSN: [id number] Arrival date & time: 02/07/21  2336     History Chief Complaint  Patient presents with   Chest Pain    Darius Bailey is a 35 y.o. male.  HPI Patient presents with concern of cough, congestion, dyspnea.  He does smoke marijuana and cigarettes.  However, he was in his usual state of health until the past few weeks.  He is gradually become aware of cough, congestion, dyspnea.  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  No current chest pain, no complete syncope, no fever.  Relief with not being at work.    Past Medical History:  Diagnosis Date   Bronchitis     There are no problems to display for this patient.   Past Surgical History:  Procedure Laterality Date   NO PAST SURGERIES     PERCUTANEOUS PINNING  07/15/2011   Procedure: PERCUTANEOUS PINNING EXTREMITY;  Surgeon: Marlowe Shores, MD;  Location: [facility] OR;  Service: Orthopedics;  Laterality: Right;  right ring and small metacarpal       Family History  Problem Relation Age of Onset   Anesthesia problems Neg Hx     Social History   Tobacco Use   Smoking status: Every Day    Packs/day: 0.25    Types: Cigarettes   Smokeless tobacco: Never  Substance Use Topics   Alcohol use: Yes    Comment: 5 shots   Drug use: Yes    Frequency: 1.0 times per week    Types: Marijuana    Home Medications Prior to Admission medications   Medication Sig Start Date End Date Taking? Authorizing Provider  methylPREDNISolone (MEDROL DOSEPAK) 4 MG TBPK tablet Please follow instructions on packaging. 02/08/21  Yes Gerhard Munch, MD  albuterol (VENTOLIN HFA) 108 (90 Base) MCG/ACT inhaler Inhale 1-2 puffs into the lungs every 6 (six) hours as needed for wheezing or shortness of breath. 12/30/18   Khatri, Hina, PA-C  benzonatate (TESSALON) 100  MG capsule Take 1 capsule (100 mg total) by mouth 3 (three) times daily as needed for cough. 01/17/21   Placido Sou, PA-C  chlorhexidine (PERIDEX) 0.12 % solution Use as directed 15 mLs in the mouth or throat 2 (two) times daily. Rinse and spit. Do not swallow Peridex. 04/21/19   Bill Salinas, PA-C  promethazine (PHENERGAN) 25 MG tablet Take 1 tablet (25 mg total) by mouth every 6 (six) hours as needed for nausea or vomiting. 02/12/18   Molpus, John, MD    Allergies    Penicillins  Review of Systems   Review of Systems  Constitutional:        Per HPI, otherwise negative  HENT:         Per HPI, otherwise negative  Respiratory:         Per HPI, otherwise negative  Cardiovascular:        Per HPI, otherwise negative  Gastrointestinal:  Negative for vomiting.  Endocrine:       Negative aside from HPI  Genitourinary:        Neg aside from HPI   Musculoskeletal:        Per HPI, otherwise negative  Skin: Negative.   Neurological:  Negative for syncope.   Physical Exam Updated Vital Signs BP 119/84   Pulse 72   Temp 97.8 F (36.6 C)  Resp 17   Ht 5\' 8"  (1.727 m)   Wt 83 kg   SpO2 100%   BMI 27.82 kg/m   Physical Exam Vitals and nursing note reviewed.  Constitutional:      General: He is not in acute distress.    Appearance: He is well-developed.  HENT:     Head: Normocephalic and atraumatic.  Eyes:     Conjunctiva/sclera: Conjunctivae normal.  Cardiovascular:     Rate and Rhythm: Normal rate and regular rhythm.  Pulmonary:     Effort: Pulmonary effort is normal. No respiratory distress.     Breath sounds: No stridor.  Abdominal:     General: There is no distension.  Skin:    General: Skin is warm and dry.  Neurological:     Mental Status: He is alert and oriented to person, place, and time.    ED Results / Procedures / Treatments   Labs (all labs ordered are listed, but only abnormal results are displayed) Labs Reviewed - No data to display  EKG EKG  Interpretation  Date/Time:  Monday February 08 2021 00:05:48 EST Ventricular Rate:  73 PR Interval:  186 QRS Duration: 80 QT Interval:  378 QTC Calculation: 416 R Axis:   59 Text Interpretation: Normal sinus rhythm with sinus arrhythmia Artifact Otherwise within normal limits Confirmed by 02-14-1996 [phone]) on 02/08/2021 8:20:49 AM  Radiology DG Chest 2 View  Result Date: 02/08/2021 CLINICAL DATA:  Possible toxic exposure. Pneumonitis with dyspnea and cough. EXAM: CHEST - 2 VIEW COMPARISON:  12/30/2018 FINDINGS: Normal heart size and mediastinal contours. No acute infiltrate or edema. No effusion or pneumothorax. No acute osseous findings. IMPRESSION: Negative chest. Electronically Signed   By: 01/01/2019 M.D.   On: 02/08/2021 09:20    Procedures Procedures   Medications Ordered in ED Medications  albuterol (VENTOLIN HFA) 108 (90 Base) MCG/ACT inhaler 2 puff (has no administration in time range)  predniSONE (DELTASONE) tablet 50 mg (has no administration in time range)    ED Course  I have reviewed the triage vital signs and the nursing notes.  Pertinent labs & imaging results that were available during my care of the patient were reviewed by me and considered in my medical decision making (see chart for details).  Pulse ox 99% room air normal  On repeat exam patient is in no distress  Patient advised that our machine for interpretation of carboxyhemoglobin values is not functioning.  Expressed some frustration at this.  However, with unremarkable vitals as above, no hypoxia, and little evidence for systemic toxicity, indication is for removal from exposure, which can be facilitated by the patient. MDM Rules/Calculators/A&P Adult male who does smoke cigarettes and marijuana, presents with new dyspnea, cough, congestion.  Given his description of working in a contaminate workspace there is some suspicion for chemical pneumonitis.  No evidence for pneumothorax, pneumonia,  bacteremia, sepsis.  X-ray reassuring, reviewed, considered. Given the absence of other hemodynamic abnormalities, patient was instructed to avoid further exposure, started on steroid Dosepak, albuterol for relief, discharged in stable condition. Final Clinical Impression(s) / ED Diagnoses Final diagnoses:  Pneumonitis due to fumes [facility])    Rx / DC Orders ED Discharge Orders          Ordered    methylPREDNISolone (MEDROL DOSEPAK) 4 MG TBPK tablet        02/08/21 0932             02/10/21, MD 02/08/21 [phone]  Gerhard Munch, MD 02/08/21 [phone]

## 2021-02-08 NOTE — ED Provider Notes (Signed)
MSE was initiated and I personally evaluated the patient and placed orders (if any) at  12:29 AM on February 08, 2021.  Patient to ED for evaluation of lightheadedness and chest tightness that started today at work. He reports concern for exposure to diesel fuel exhaust for an extended period over the last 2 days.   Today's Vitals   02/08/21 0014  BP: (!) 130/91  Pulse: 83  Resp: 16  Temp: 97.8 F (36.6 C)  SpO2: 97%   There is no height or weight on file to calculate BMI.  Lungs clear In NAD, VSS, no hypoxia, tachypnea, tachycardia.   The patient appears stable so that the remainder of the MSE may be completed by another provider.   Elpidio Anis, PA-C 02/08/21 0031    Tilden Fossa, MD 02/08/21 [phone]

## 2021-06-02 ENCOUNTER — Other Ambulatory Visit: Payer: Self-pay

## 2021-06-02 ENCOUNTER — Ambulatory Visit
Admission: EM | Admit: 2021-06-02 | Discharge: 2021-06-02 | Disposition: A | Discharge status: Home / Self Care | Payer: BLUE CROSS/BLUE SHIELD | Attending: Emergency Medicine | Admitting: Emergency Medicine

## 2021-06-02 DIAGNOSIS — J302 Other seasonal allergic rhinitis: Secondary | ICD-10-CM | POA: Diagnosis not present

## 2021-06-02 DIAGNOSIS — J310 Chronic rhinitis: Secondary | ICD-10-CM | POA: Diagnosis not present

## 2021-06-02 DIAGNOSIS — J45901 Unspecified asthma with (acute) exacerbation: Secondary | ICD-10-CM

## 2021-06-02 DIAGNOSIS — J329 Chronic sinusitis, unspecified: Secondary | ICD-10-CM

## 2021-06-02 MED ORDER — ALBUTEROL SULFATE HFA 108 (90 BASE) MCG/ACT IN AERS: 2.0000 | INHALATION_SPRAY | Freq: Four times a day (QID) | RESPIRATORY_TRACT | 1 refills | Status: AC | PRN

## 2021-06-02 MED ORDER — METHYLPREDNISOLONE SODIUM SUCC 125 MG IJ SOLR
125.0000 mg | Freq: Once | INTRAMUSCULAR | Status: AC
  Administered 2021-06-02: 125 mg via INTRAMUSCULAR

## 2021-06-02 MED ORDER — PROMETHAZINE-DM 6.25-15 MG/5ML PO SYRP: 5.0000 mL | ORAL_SOLUTION | Freq: Four times a day (QID) | ORAL | 0 refills | Status: DC | PRN

## 2021-06-02 MED ORDER — IPRATROPIUM BROMIDE 0.06 % NA SOLN: 2.0000 | Freq: Three times a day (TID) | NASAL | 1 refills | Status: DC

## 2021-06-02 MED ORDER — FLUTICASONE PROPIONATE 50 MCG/ACT NA SUSP: 2.0000 | Freq: Every day | NASAL | 1 refills | Status: DC

## 2021-06-02 MED ORDER — FEXOFENADINE HCL 180 MG PO TABS: 180.0000 mg | ORAL_TABLET | Freq: Every day | ORAL | 0 refills | Status: DC

## 2021-06-02 NOTE — ED Triage Notes (Addendum)
Pt c/o cough, fatigue and itchy throat. Pt states he does have some chest pain that is also in his upper abd (he states it "feels like my lungs shake"). Patient states he thought it was his allergies.  ?Started: 4-5 days ago ?

## 2021-06-02 NOTE — Discharge Instructions (Addendum)
Your symptoms and my physical exam findings are concerning for exacerbation of your underlying allergies.  It is important that you are consistent with taking allergy medications exactly as prescribed and that you set yourself a reminder to begin them every February to avoid having another episode of allergic bronchitis. ? ?Please see the list below for recommended medications, dosages and frequencies to provide relief of your current symptoms:   ?  ?Methylprednisolone IM (Solu-Medrol):  To quickly address your significant respiratory inflammation, you were provided with an injection of methylprednisolone in the office today.  You should continue to feel the full benefit of the steroid for the next 4 to 6 hours.  ?  ?Allegra (fexofenadine): This is an excellent second-generation antihistamine that helps to reduce respiratory inflammatory response to environmental allergens.  This medication is not known to cause daytime sleepiness so it can be taken in the daytime.  If you find that it does make you sleepy, please feel free to take it at bedtime. ?  ?Flonase (fluticasone): This is a steroid nasal spray that you use once daily, 1 spray in each nare.  This medication does not work well if you decide to use it only used as you feel you need to, it works best used on a daily basis.  After 3 to 5 days of use, you will notice significant reduction of the inflammation and mucus production that is currently being caused by exposure to allergens, whether seasonal or environmental.  The most common side effect of this medication is nosebleeds.  If you experience a nosebleed, please discontinue use for 1 week, then feel free to resume.  I have provided you with a prescription but you can also purchase this medication over-the-counter if your insurance will not cover it. ?  ?Ipratropium (Atrovent): This is an excellent nasal decongestant spray that does not cause rebound congestion, please instill 2 sprays into each nare with  each use.  Because nasal steroids can take several days before they begin to provide full benefit, I recommend that you use this spray in addition to the nasal steroid prescribed for you.  Please use it after you have used your nasal steroid and repeat up to 4 times daily as needed.  I have provided you with a prescription for this medication.    ?  ?Promethazine DM: Promethazine is both a nasal decongestant and an antinausea medication that makes most patients feel fairly sleepy.  The DM is dextromethorphan, a cough suppressant found in many over-the-counter cough medications.  Please take 5 mL before bedtime to minimize your cough which will help you sleep better.  I have provided you with a prescription for this medication.    ?  ?Not taking your allergy medications on a regular basis increases your risk of more frequent upper respiratory infections that may or may not require the use of antibiotics, serious exacerbations that require the use of oral steroids, loss of time at work and missed social opportunities.  ?  ?Please follow-up within the next 7-10 days either with your primary care provider or urgent care if your symptoms do not resolve.  If you do not have a primary care provider, we will assist you in finding one. ?  ?Thank you for visiting urgent care today.  It was a pleasure to meet you.  We appreciate the opportunity to participate in your care. ? ?

## 2021-06-02 NOTE — ED Provider Notes (Signed)
?[facility] ? ? ? ?CSN: [id number] ?Arrival date & time: 06/02/21  0805 ?  ? ?HISTORY  ? ?Chief Complaint  ?Patient presents with  ? Fatigue  ? Chest Pain  ? ?HPI ?Darius Bailey is a 36 y.o. male. Pt c/o cough, fatigue and itchy throat. Pt reports some chest pain that is also in his upper abd, he states it "feels like my lungs shake" when he coughs.  EMR reviewed, pt has hx of multiple ED visits for similar respiratory complaints.  Pt is not vaccinated for COVID-19 or receive annual vaccinations for influenza.  Patient tested positive for COVID-19 on 03/07/2020, 11/16/2020 and 01/17/2021: he tested positive for influenza A on 01/17/2021 as well.  Patient also has a history of being diagnosed with pneumonitis, bronchitis and multiple episodes of influenza-like illness.  Patient has been prescribed albuterol in the past along with multiple cough medications.  Last EKG in 02/09/2021 showed sinus arrhythmia but was otherwise normal.  EKG today again demonstrates sinus arrhythmia but is now showing minimal criteria for LVH, blood pressure is normal on arrival today.  Patient has an oral temperature of 97.7, denies drinking any beverages within 2 hours prior to visit. ? ?The history is provided by the patient.   ?Past Medical History:  ?Diagnosis Date  ? Bronchitis   ? ?There are no problems to display for this patient. ? ?Past Surgical History:  ?Procedure Laterality Date  ? NO PAST SURGERIES    ? PERCUTANEOUS PINNING  07/15/2011  ? Procedure: PERCUTANEOUS PINNING EXTREMITY;  Surgeon: Marlowe Shores, MD;  Location: [facility] OR;  Service: Orthopedics;  Laterality: Right;  right ring and small metacarpal  ? ? ?Home Medications   ? ?Prior to Admission medications   ?Medication Sig Start Date End Date Taking? Authorizing Provider  ?albuterol (VENTOLIN HFA) 108 (90 Base) MCG/ACT inhaler Inhale 1-2 puffs into the lungs every 6 (six) hours as needed for wheezing or shortness of breath. 12/30/18   Dietrich Pates, PA-C   ? ?Family History ?Family History  ?Problem Relation Age of Onset  ? Anesthesia problems Neg Hx   ? ?Social History ?Social History  ? ?Tobacco Use  ? Smoking status: Some Days  ?  Types: Cigarettes  ? Smokeless tobacco: Never  ?Substance Use Topics  ? Alcohol use: Yes  ?  Comment: 5 shots  ? Drug use: Yes  ?  Frequency: 1.0 times per week  ?  Types: Marijuana  ? ?Allergies   ?Penicillins ? ?Review of Systems ?Review of Systems ?Pertinent findings noted in history of present illness.  ? ?Physical Exam ?Triage Vital Signs ?ED Triage Vitals  ?Enc Vitals Group  ?   BP 01/08/21 0827 (!) 147/82  ?   Pulse Rate 01/08/21 0827 72  ?   Resp 01/08/21 0827 18  ?   Temp 01/08/21 0827 98.3 ?F (36.8 ?C)  ?   Temp Source 01/08/21 0827 Oral  ?   SpO2 01/08/21 0827 98 %  ?   Weight --   ?   Height --   ?   Head Circumference --   ?   Peak Flow --   ?   Pain Score 01/08/21 0826 5  ?   Pain Loc --   ?   Pain Edu? --   ?   Excl. in GC? --   ?No data found. ? ?Updated Vital Signs ?BP 121/74 (BP Location: Right Arm)   Pulse 71   Temp 97.7 ?F (36.5 ?  C) (Oral)   Resp 18   SpO2 97%  ? ?Physical Exam ?Vitals and nursing note reviewed.  ?Constitutional:   ?   General: He is not in acute distress. ?   Appearance: Normal appearance. He is not ill-appearing.  ?HENT:  ?   Head: Normocephalic and atraumatic.  ?   Salivary Glands: Right salivary gland is not diffusely enlarged or tender. Left salivary gland is not diffusely enlarged or tender.  ?   Right Ear: Ear canal and external ear normal. No drainage. A middle ear effusion is present. There is no impacted cerumen. Tympanic membrane is bulging. Tympanic membrane is not injected or erythematous.  ?   Left Ear: Ear canal and external ear normal. No drainage. A middle ear effusion is present. There is no impacted cerumen. Tympanic membrane is bulging. Tympanic membrane is not injected or erythematous.  ?   Ears:  ?   Comments: Bilateral EACs normal, both TMs bulging with clear fluid ?   Nose:  Rhinorrhea present. No nasal deformity, septal deviation, signs of injury, nasal tenderness, mucosal edema or congestion. Rhinorrhea is clear.  ?   Right Nostril: Occlusion present. No foreign body, epistaxis or septal hematoma.  ?   Left Nostril: Occlusion present. No foreign body, epistaxis or septal hematoma.  ?   Right Turbinates: Enlarged, swollen and pale.  ?   Left Turbinates: Enlarged, swollen and pale.  ?   Right Sinus: No maxillary sinus tenderness or frontal sinus tenderness.  ?   Left Sinus: No maxillary sinus tenderness or frontal sinus tenderness.  ?   Mouth/Throat:  ?   Lips: Pink. No lesions.  ?   Mouth: Mucous membranes are moist. No oral lesions.  ?   Pharynx: Oropharynx is clear. Uvula midline. No posterior oropharyngeal erythema or uvula swelling.  ?   Tonsils: No tonsillar exudate. 0 on the right. 0 on the left.  ?   Comments: Postnasal drip ?Eyes:  ?   General: Lids are normal.     ?   Right eye: No discharge.     ?   Left eye: No discharge.  ?   Extraocular Movements: Extraocular movements intact.  ?   Conjunctiva/sclera: Conjunctivae normal.  ?   Right eye: Right conjunctiva is not injected.  ?   Left eye: Left conjunctiva is not injected.  ?Neck:  ?   Trachea: Trachea and phonation normal.  ?Cardiovascular:  ?   Rate and Rhythm: Normal rate and regular rhythm.  ?   Pulses: Normal pulses.  ?   Heart sounds: Normal heart sounds. No murmur heard. ?  No friction rub. No gallop.  ?Pulmonary:  ?   Effort: Pulmonary effort is normal. No tachypnea, bradypnea, accessory muscle usage, prolonged expiration, respiratory distress or retractions.  ?   Breath sounds: Normal breath sounds and air entry. No stridor, decreased air movement or transmitted upper airway sounds. No decreased breath sounds, wheezing, rhonchi or rales.  ?   Comments: Bronchospasm with deep cough ?Chest:  ?   Chest wall: No tenderness.  ?Musculoskeletal:     ?   General: Normal range of motion.  ?   Cervical back: Normal range of  motion and neck supple. Normal range of motion.  ?Lymphadenopathy:  ?   Cervical: No cervical adenopathy.  ?Skin: ?   General: Skin is warm and dry.  ?   Findings: No erythema or rash.  ?Neurological:  ?   General: No focal deficit present.  ?  Mental Status: He is alert and oriented to person, place, and time.  ?Psychiatric:     ?   Mood and Affect: Mood normal.     ?   Behavior: Behavior normal.  ? ? ?Visual Acuity ?Right Eye Distance:   ?Left Eye Distance:   ?Bilateral Distance:   ? ?Right Eye Near:   ?Left Eye Near:    ?Bilateral Near:    ? ?[facility] Couse / Diagnostics / Procedures:  ?  ?EKG ? ?Radiology ?No results found. ? ?Procedures ?Procedures (including critical care time) ? ?[facility] Diagnoses / Final Clinical Impressions(s)   ?I have reviewed the triage vital signs and the nursing notes. ? ?Pertinent labs & imaging results that were available during my care of the patient were reviewed by me and considered in my medical decision making (see chart for details).   ?Final diagnoses:  ?Seasonal allergic rhinitis, unspecified trigger  ?Bronchitis, allergic, unspecified asthma severity, with acute exacerbation  ?Rhinosinusitis  ? ?Patient advised that his symptoms at this time are likely due to underlying allergies that are not being addressed, particularly since he has recurring symptoms every year in the springtime.  Patient provided with a renewal of his albuterol inhaler which is likely expired.  Patient also advised to begin allergy medications at this time.  Patient provided with an injection of methylprednisolone in the office which he states he has had in the past for previous episodes of bronchitis that have been helpful and quickly resolving his symptoms.  Return precautions advised. ? ?Results of EKG discussed today, patient does report a history of smoking, last cigarette was smoked the night before his visit today.  Patient advised that the LVH is largely a result of uncontrolled hypertension and that  cigarette smoking is likely transiently elevating his blood pressure.  Patient advised that at this time I do not believe that he needs a cardiology follow-up but instead he should consider quitting smoking. ? ?ED P

## 2021-06-02 NOTE — ED Notes (Signed)
EKG results given to provider.  

## 2021-10-22 ENCOUNTER — Emergency Department (HOSPITAL_COMMUNITY)
Admission: EM | Admit: 2021-10-22 | Discharge: 2021-10-22 | Disposition: A | Discharge status: Home / Self Care | Payer: BLUE CROSS/BLUE SHIELD | Attending: Emergency Medicine | Admitting: Emergency Medicine

## 2021-10-22 ENCOUNTER — Other Ambulatory Visit: Payer: Self-pay

## 2021-10-22 ENCOUNTER — Emergency Department (HOSPITAL_COMMUNITY): Payer: BLUE CROSS/BLUE SHIELD

## 2021-10-22 ENCOUNTER — Encounter (HOSPITAL_COMMUNITY): Payer: Self-pay | Admitting: *Deleted

## 2021-10-22 DIAGNOSIS — R55 Syncope and collapse: Secondary | ICD-10-CM | POA: Diagnosis not present

## 2021-10-22 DIAGNOSIS — X58XXXA Exposure to other specified factors, initial encounter: Secondary | ICD-10-CM | POA: Insufficient documentation

## 2021-10-22 DIAGNOSIS — E860 Dehydration: Secondary | ICD-10-CM | POA: Diagnosis not present

## 2021-10-22 DIAGNOSIS — S00511A Abrasion of lip, initial encounter: Secondary | ICD-10-CM | POA: Insufficient documentation

## 2021-10-22 DIAGNOSIS — S0990XA Unspecified injury of head, initial encounter: Secondary | ICD-10-CM | POA: Diagnosis present

## 2021-10-22 LAB — TSH: TSH: 0.41 u[IU]/mL (ref 0.350–4.500)

## 2021-10-22 LAB — COMPREHENSIVE METABOLIC PANEL
ALT: 21 U/L (ref 0–44)
AST: 23 U/L (ref 15–41)
Albumin: 3.4 g/dL — ABNORMAL LOW (ref 3.5–5.0)
Alkaline Phosphatase: 72 U/L (ref 38–126)
Anion gap: 6 (ref 5–15)
BUN: 10 mg/dL (ref 6–20)
CO2: 25 mmol/L (ref 22–32)
Calcium: 8.9 mg/dL (ref 8.9–10.3)
Chloride: 109 mmol/L (ref 98–111)
Creatinine, Ser: 1.22 mg/dL (ref 0.61–1.24)
GFR, Estimated: >60 mL/min (ref >60)
Glucose, Bld: 101 mg/dL — ABNORMAL HIGH (ref 70–99)
Potassium: 3.8 mmol/L (ref 3.5–5.1)
Sodium: 140 mmol/L (ref 135–145)
Total Bilirubin: 0.8 mg/dL (ref 0.3–1.2)
Total Protein: 5.6 g/dL — ABNORMAL LOW (ref 6.5–8.1)

## 2021-10-22 LAB — CBC WITH DIFFERENTIAL/PLATELET
Abs Immature Granulocytes: 0.01 10*3/uL (ref 0.00–0.07)
Basophils Absolute: 0 10*3/uL (ref 0.0–0.1)
Basophils Relative: 0 %
Eosinophils Absolute: 0.2 10*3/uL (ref 0.0–0.5)
Eosinophils Relative: 3 %
HCT: 38.2 % — ABNORMAL LOW (ref 39.0–52.0)
Hemoglobin: 12.7 g/dL — ABNORMAL LOW (ref 13.0–17.0)
Immature Granulocytes: 0 %
Lymphocytes Relative: 36 %
Lymphs Abs: 1.9 10*3/uL (ref 0.7–4.0)
MCH: 31.7 pg (ref 26.0–34.0)
MCHC: 33.2 g/dL (ref 30.0–36.0)
MCV: 95.3 fL (ref 80.0–100.0)
Monocytes Absolute: 0.8 10*3/uL (ref 0.1–1.0)
Monocytes Relative: 15 %
Neutro Abs: 2.5 10*3/uL (ref 1.7–7.7)
Neutrophils Relative %: 46 %
Platelets: 225 10*3/uL (ref 150–400)
RBC: 4.01 MIL/uL — ABNORMAL LOW (ref 4.22–5.81)
RDW: 11.8 % (ref 11.5–15.5)
WBC: 5.4 10*3/uL (ref 4.0–10.5)
nRBC: 0 % (ref 0.0–0.2)

## 2021-10-22 LAB — URINALYSIS, ROUTINE W REFLEX MICROSCOPIC
Bacteria, UA: NONE SEEN
Bilirubin Urine: NEGATIVE
Glucose, UA: NEGATIVE mg/dL
Ketones, ur: NEGATIVE mg/dL
Leukocytes,Ua: NEGATIVE
Nitrite: NEGATIVE
Protein, ur: 30 mg/dL — AB
Specific Gravity, Urine: 1.012 (ref 1.005–1.030)
pH: 6 (ref 5.0–8.0)

## 2021-10-22 LAB — MAGNESIUM: Magnesium: 1.9 mg/dL (ref 1.7–2.4)

## 2021-10-22 LAB — TROPONIN I (HIGH SENSITIVITY)
Troponin I (High Sensitivity): 3 ng/L (ref <18)
Troponin I (High Sensitivity): 3 ng/L (ref <18)

## 2021-10-22 LAB — CK: Total CK: 298 U/L (ref 49–397)

## 2021-10-22 LAB — CBG MONITORING, ED: Glucose-Capillary: 98 mg/dL (ref 70–99)

## 2021-10-22 MED ORDER — SODIUM CHLORIDE 0.9 % IV BOLUS
1000.0000 mL | Freq: Once | INTRAVENOUS | Status: AC
  Administered 2021-10-22: 1000 mL via INTRAVENOUS

## 2021-10-22 NOTE — ED Provider Notes (Signed)
[facility] EMERGENCY DEPARTMENT Provider Note   CSN: [id number] Arrival date & time: 10/22/21  0841     History  No chief complaint on file.   Darius Bailey is a 36 y.o. male.  The history is provided by the patient and medical records. No language interpreter was used.  Loss of Consciousness Episode history:  Multiple Most recent episode:  Today Timing:  Sporadic Progression:  Resolved Chronicity:  Recurrent Context: standing up   Context: not urination   Relieved by:  Nothing Worsened by:  Nothing Ineffective treatments:  None tried Associated symptoms: headaches (after head trauma) and malaise/fatigue   Associated symptoms: no anxiety, no chest pain, no confusion, no diaphoresis, no dizziness, no fever, no focal weakness, no nausea, no palpitations, no recent surgery, no seizures, no shortness of breath, no visual change, no vomiting and no weakness        Home Medications Prior to Admission medications   Medication Sig Start Date End Date Taking? Authorizing Provider  albuterol (VENTOLIN HFA) 108 (90 Base) MCG/ACT inhaler Inhale 2 puffs into the lungs every 6 (six) hours as needed for wheezing or shortness of breath (Cough). 06/02/21   Theadora Rama Scales, PA-C  fexofenadine (ALLEGRA) 180 MG tablet Take 1 tablet (180 mg total) by mouth daily. 06/02/21 11/29/21  Theadora Rama Scales, PA-C  fluticasone (FLONASE) 50 MCG/ACT nasal spray Place 2 sprays into both nostrils daily. 06/02/21   Theadora Rama Scales, PA-C  ipratropium (ATROVENT) 0.06 % nasal spray Place 2 sprays into both nostrils 3 (three) times daily. As needed for nasal congestion, runny nose 06/02/21   Theadora Rama Scales, PA-C  promethazine-dextromethorphan (PROMETHAZINE-DM) 6.25-15 MG/5ML syrup Take 5 mLs by mouth 4 (four) times daily as needed for cough. 06/02/21   Theadora Rama Scales, PA-C      Allergies    Penicillins    Review of Systems   Review of Systems   Constitutional:  Positive for fatigue and malaise/fatigue. Negative for chills, diaphoresis and fever.  HENT:  Negative for congestion.   Respiratory:  Negative for cough, chest tightness, shortness of breath and wheezing.   Cardiovascular:  Positive for syncope. Negative for chest pain, palpitations and leg swelling.  Gastrointestinal:  Negative for abdominal pain, constipation, diarrhea, nausea and vomiting.  Genitourinary:  Negative for flank pain.  Musculoskeletal:  Negative for back pain, neck pain and neck stiffness.  Skin:  Positive for wound (lip). Negative for rash.  Neurological:  Positive for syncope, light-headedness and headaches (after head trauma). Negative for dizziness, focal weakness, seizures, speech difficulty and weakness.  Psychiatric/Behavioral:  Negative for agitation and confusion.   All other systems reviewed and are negative.   Physical Exam Updated Vital Signs BP 103/72 (BP Location: Right Arm)   Pulse (!) 55   Temp 97.9 F (36.6 C) (Oral)   Resp 16   Ht 5\' 8"  (1.727 m)   Wt 80.7 kg   SpO2 100%   BMI 27.06 kg/m  Physical Exam Vitals and nursing note reviewed.  Constitutional:      General: He is not in acute distress.    Appearance: He is well-developed. He is not ill-appearing, toxic-appearing or diaphoretic.  HENT:     Head: Normocephalic. Abrasion present.     Comments: Small abrasion to lower lip that is hemostatic.  No laceration requiring repair at this time    Nose: Nose normal.     Mouth/Throat:     Mouth: Mucous membranes are dry.  Pharynx: No oropharyngeal exudate or posterior oropharyngeal erythema.  Eyes:     Extraocular Movements: Extraocular movements intact.     Conjunctiva/sclera: Conjunctivae normal.     Pupils: Pupils are equal, round, and reactive to light.  Cardiovascular:     Rate and Rhythm: Normal rate and regular rhythm.     Heart sounds: No murmur heard. Pulmonary:     Effort: Pulmonary effort is normal. No  respiratory distress.     Breath sounds: Normal breath sounds. No wheezing, rhonchi or rales.  Chest:     Chest wall: No tenderness.  Abdominal:     General: Abdomen is flat.     Palpations: Abdomen is soft.     Tenderness: There is no abdominal tenderness. There is no guarding or rebound.  Musculoskeletal:        General: No swelling or tenderness.     Cervical back: Neck supple. No tenderness.     Right lower leg: No edema.     Left lower leg: No edema.  Skin:    General: Skin is warm and dry.     Capillary Refill: Capillary refill takes less than 2 seconds.     Findings: No erythema or rash.  Neurological:     General: No focal deficit present.     Mental Status: He is alert.     Sensory: No sensory deficit.     Motor: No weakness.  Psychiatric:        Mood and Affect: Mood normal.     ED Results / Procedures / Treatments   Labs (all labs ordered are listed, but only abnormal results are displayed) Labs Reviewed  CBC WITH DIFFERENTIAL/PLATELET - Abnormal; Notable for the following components:      Result Value   RBC 4.01 (*)    Hemoglobin 12.7 (*)    HCT 38.2 (*)    All other components within normal limits  COMPREHENSIVE METABOLIC PANEL - Abnormal; Notable for the following components:   Glucose, Bld 101 (*)    Total Protein 5.6 (*)    Albumin 3.4 (*)    All other components within normal limits  URINALYSIS, ROUTINE W REFLEX MICROSCOPIC - Abnormal; Notable for the following components:   Hgb urine dipstick MODERATE (*)    Protein, ur 30 (*)    All other components within normal limits  TSH  MAGNESIUM  CK  CBG MONITORING, ED  TROPONIN I (HIGH SENSITIVITY)  TROPONIN I (HIGH SENSITIVITY)    EKG EKG Interpretation  Date/Time:  Friday October 22 2021 08:54:24 EDT Ventricular Rate:  107 PR Interval:  158 QRS Duration: 85 QT Interval:  333 QTC Calculation: 445 R Axis:   63 Text Interpretation: Sinus tachycardia when compared to prior, faster rate. No STEMI  Confirmed by Theda Belfast ([id number]) on 10/22/2021 9:23:23 AM  Radiology CT Cervical Spine Wo Contrast  Result Date: 10/22/2021 CLINICAL DATA:  Fall, trauma EXAM: CT CERVICAL SPINE WITHOUT CONTRAST TECHNIQUE: Multidetector CT imaging of the cervical spine was performed without intravenous contrast. Multiplanar CT image reconstructions were also generated. RADIATION DOSE REDUCTION: This exam was performed according to the departmental dose-optimization program which includes automated exposure control, adjustment of the mA and/or kV according to patient size and/or use of iterative reconstruction technique. COMPARISON:  None Available. FINDINGS: Alignment: Normal. Skull base and vertebrae: No acute fracture. No primary bone lesion or focal pathologic process. Well-circumscribed benign-appearing sclerosis of the T2 vertebral body favored to represent bone island. Soft tissues and spinal canal: No  prevertebral fluid or swelling. No visible canal hematoma. Disc levels: Preserved vertebral body heights and disc spaces. No significant degenerative changes or spondylosis. Facets are aligned. No subluxation or dislocation. Intact odontoid. Upper chest: Negative. Other: None. IMPRESSION: No acute cervical spine osseous abnormality, fracture or malalignment. Electronically Signed   By: Judie Petit.  Shick M.D.   On: 10/22/2021 09:47   CT HEAD WO CONTRAST ( )  Result Date: 10/22/2021 CLINICAL DATA:  Fall, syncope, head injury EXAM: CT HEAD WITHOUT CONTRAST TECHNIQUE: Contiguous axial images were obtained from the base of the skull through the vertex without intravenous contrast. RADIATION DOSE REDUCTION: This exam was performed according to the departmental dose-optimization program which includes automated exposure control, adjustment of the mA and/or kV according to patient size and/or use of iterative reconstruction technique. COMPARISON:  None Available. FINDINGS: Brain: No evidence of acute infarction, hemorrhage,  hydrocephalus, extra-axial collection or mass lesion/mass effect. Vascular: No hyperdense vessel or unexpected calcification. Skull: Normal. Negative for fracture or focal lesion. Sinuses/Orbits: Minor left frontal sinus mucosal thickening. Other sinuses remain clear. Mastoids are also clear. No orbital abnormality or asymmetry. Other: None. IMPRESSION: No acute intracranial abnormality by noncontrast CT. Electronically Signed   By: Judie Petit.  Shick M.D.   On: 10/22/2021 09:42   DG Chest Portable 1 View  Result Date: 10/22/2021 CLINICAL DATA:  Syncope, hypotension, bradycardia.  Fall. EXAM: PORTABLE CHEST 1 VIEW COMPARISON:  Chest radiographs 02/08/2021 FINDINGS: The cardiomediastinal silhouette is unchanged with normal heart size. Lung volumes are lower than on the prior study. No airspace consolidation, edema, sizable pleural effusion, or pneumothorax is identified. No acute osseous abnormality is seen. IMPRESSION: No active disease. Electronically Signed   By: Sebastian Ache M.D.   On: 10/22/2021 09:23    Procedures Procedures    Medications Ordered in ED Medications  sodium chloride 0.9 % bolus 1,000 mL (0 mLs Intravenous Stopped 10/22/21 1112)    ED Course/ Medical Decision Making/ A&P                           Medical Decision Making Amount and/or Complexity of Data Reviewed Labs: ordered. Radiology: ordered.    Darius Bailey is a 36 y.o. male with no significant past medical history who presents with syncope.  According to EMS report to nursing and patient's report, patient got up early this morning and started walking to the bathroom and got lightheaded and then passed out.  He then tried to get up and Walking and then passed out a second time hitting a laundry basket on the ground.  He slightly bit his lip with an abrasion and had some bleeding and EMS was called when he was still fatigued.  According to nursing report, EMS found his blood pressure was 60 systolic initially and was  bradycardic in the 40s so they gave him fluids and some atropine.  Heart rate since improved.  Patient is denying any palpitations, chest pain, or shortness of breath.  He reports just getting lightheaded and passing out when he stood up this morning.  He reports he has had a history of syncope in the past when he stood up.  He says he has been working in hot areas at work and agrees he could be slightly dehydrated.  Denies any nausea, vomiting, constipation, or diarrhea.  Denies any trauma otherwise.  Denies any fevers, chills, congestion, or cough.  Denies any urinary changes recently.  Denies any new medication use or drug  use.  Reports he is still feeling somewhat fatigued but does not feel like he is going to pass out now.  Denies any history of DVT, PE, or family history of thromboembolic disease.  Does report history of both grandparents having some heart disease but does not sure what kind.  No reported family history of cardiomyopathies.  On exam, lungs clear and chest nontender.  Abdomen nontender.  No murmur.  Good pulses in extremities.  Patient is small abrasion to his bottom lip that is hemostatic.  Mucous membranes are dry but otherwise patient is well-appearing.  He is in a cervical immobilization collar due to some headache and soreness after the fall.  We will get CT head and neck for the trauma to rule out acute injuries.  Will get chest x-ray and labs to rule out occult infectious causes of his hypotension however given his report that he has been working in the hot environment this week, I suspect he is dehydrated leading to orthostatic syncope.  We will give some fluids and check these labs.  If workup is reassuring and imaging is reassuring, anticipate discharge home for outpatient follow-up  Delta troponin was negative.  Patient felt much better after fluids and was able to pass a p.o. challenge.  Patient is able to ambulate and stand without the lightheadedness.  I suspect  dehydration led to orthostatic type syncope.  Patient still wants to follow with cardiology who was given the cardiology number.  He will follow-up with them in clinic.  He has a questions or concerns and patient was discharged in good condition.         Final Clinical Impression(s) / ED Diagnoses Final diagnoses:  Near syncope  Syncope, unspecified syncope type  Dehydration    Rx / DC Orders ED Discharge Orders     None       Clinical Impression: 1. Near syncope   2. Syncope, unspecified syncope type   3. Dehydration     Disposition: Discharge  Condition: Good  I have discussed the results, Dx and Tx plan with the pt(& family if present). He/she/they expressed understanding and agree(s) with the plan. Discharge instructions discussed at great length. Strict return precautions discussed and pt &/or family have verbalized understanding of the instructions. No further questions at time of discharge.    Discharge Medication List as of 10/22/2021  1:50 PM      Follow Up: [facility] CARDIOVASCULAR DIVISION [address] [place] [id number] [phone]    [facility] EMERGENCY DEPARTMENT [address] [phone] mc [place] [address] [phone]       Nesbit Michon, Canary Brim, MD 10/22/21 [phone]

## 2021-10-22 NOTE — ED Notes (Signed)
Patient is aware he needs to get a urine spec.

## 2021-10-22 NOTE — Discharge Instructions (Signed)
Your history, exam, work-up today did not show any concerning findings including negative heart enzymes twice, reassuring chest x-ray, and your vital signs were reassuring.  Your EKG did not show critical abnormalities and your labs were otherwise reassuring as well.  We feel you are safe for discharge home and likely had lightheadedness and syncopal episode related to dehydration.  Please rest and stay hydrated and follow-up with your primary doctor.  Due to your other history, please follow-up with cardiology and if any symptoms change or worsen, please return to the nearest emergency department.

## 2021-10-22 NOTE — ED Notes (Signed)
Family at bedside. 

## 2021-10-22 NOTE — ED Notes (Signed)
[place] sandwich given , family at bedside.

## 2021-10-22 NOTE — ED Triage Notes (Signed)
Patient presents to ed via [facility] states patient got up this am was going to the bathroom and had a syncopal episode dried blood on his lips no tongue injury or loose teeth. Per ems patient had heartrate of 50 with a b/p of 60/pal was given 500 cc NS heart rate decreased to 42 was given Atropine 1 mg per ems heart rate increased to 90 was given total of 750cc NS per ems. C/o nausea was given zofran 4 mg iv. Upon arrival to ed alert oriented denies pain

## 2021-11-15 ENCOUNTER — Other Ambulatory Visit: Payer: Self-pay

## 2021-11-15 ENCOUNTER — Emergency Department (HOSPITAL_BASED_OUTPATIENT_CLINIC_OR_DEPARTMENT_OTHER)
Admission: EM | Admit: 2021-11-15 | Discharge: 2021-11-15 | Disposition: A | Discharge status: Home / Self Care | Payer: BLUE CROSS/BLUE SHIELD | Attending: Emergency Medicine | Admitting: Emergency Medicine

## 2021-11-15 DIAGNOSIS — K0889 Other specified disorders of teeth and supporting structures: Secondary | ICD-10-CM | POA: Diagnosis present

## 2021-11-15 DIAGNOSIS — K047 Periapical abscess without sinus: Secondary | ICD-10-CM

## 2021-11-15 MED ORDER — NAPROXEN 375 MG PO TABS: 375.0000 mg | ORAL_TABLET | Freq: Two times a day (BID) | ORAL | 0 refills | Status: DC

## 2021-11-15 MED ORDER — CLINDAMYCIN HCL 150 MG PO CAPS
150.0000 mg | ORAL_CAPSULE | Freq: Four times a day (QID) | ORAL | 0 refills | Status: DC
Start: 2021-11-15 — End: 2023-07-05

## 2021-11-15 MED ORDER — KETOROLAC TROMETHAMINE 30 MG/ML IJ SOLN
30.0000 mg | Freq: Once | INTRAMUSCULAR | Status: AC
  Administered 2021-11-15: 30 mg via INTRAMUSCULAR
  Filled 2021-11-15: qty 1

## 2021-11-15 NOTE — Discharge Instructions (Signed)
You were seen today due to dental pain and possible infection.  Your presentation is consistent with a dental infection or dental abscess.  Please take the prescribed antibiotics and anti-inflammatories.  Please follow-up as planned with dentistry this week for definitive management.

## 2021-11-15 NOTE — ED Triage Notes (Addendum)
Patient arrives ambulatory to triage with complaints of worsening dental pain (left side of mouth) x2 days. Patient states that he has a dentist appointment next week, but the pain has become intolerable.  Rates pain a 10/10.

## 2021-11-15 NOTE — ED Provider Notes (Signed)
[facility] EMERGENCY DEPT Provider Note   CSN: [id number] Arrival date & time: 11/15/21  1502     History  Chief Complaint  Patient presents with   Dental Pain    (Left)    Darius Bailey is a 36 y.o. male.  Patient presents to the hospital complaining of left-sided dental pain.  Patient states this pain has been worsening over the past 2 days.  He states that he has an upcoming dentist appointment within the next week but the pain has become intolerable.  Patient currently rates his pain as 10 out of 10 in severity.  Patient denies nausea, vomiting, fevers at home.  Patient denies trismus, changes in phonation, sore throat.  Past medical history otherwise significant for bronchitis  HPI     Home Medications Prior to Admission medications   Medication Sig Start Date End Date Taking? Authorizing Provider  clindamycin (CLEOCIN) 150 MG capsule Take 1 capsule (150 mg total) by mouth every 6 (six) hours. 11/15/21  Yes Darrick Grinder, PA-C  naproxen (NAPROSYN) 375 MG tablet Take 1 tablet (375 mg total) by mouth 2 (two) times daily. 11/15/21  Yes Darrick Grinder, PA-C  albuterol (VENTOLIN HFA) 108 (90 Base) MCG/ACT inhaler Inhale 2 puffs into the lungs every 6 (six) hours as needed for wheezing or shortness of breath (Cough). Patient not taking: Reported on 10/22/2021 06/02/21   Theadora Rama Scales, PA-C  fexofenadine (ALLEGRA) 180 MG tablet Take 1 tablet (180 mg total) by mouth daily. Patient not taking: Reported on 10/22/2021 06/02/21 11/29/21  Theadora Rama Scales, PA-C  fluticasone [facility]) 50 MCG/ACT nasal spray Place 2 sprays into both nostrils daily. Patient not taking: Reported on 10/22/2021 06/02/21   Theadora Rama Scales, PA-C  ipratropium (ATROVENT) 0.06 % nasal spray Place 2 sprays into both nostrils 3 (three) times daily. As needed for nasal congestion, runny nose Patient not taking: Reported on 10/22/2021 06/02/21   Theadora Rama Scales, PA-C   promethazine-dextromethorphan (PROMETHAZINE-DM) 6.25-15 MG/5ML syrup Take 5 mLs by mouth 4 (four) times daily as needed for cough. Patient not taking: Reported on 10/22/2021 06/02/21   Theadora Rama Scales, PA-C      Allergies    Amoxil [amoxicillin] and Penicillins    Review of Systems   Review of Systems  HENT:  Positive for dental problem. Negative for drooling, sore throat, trouble swallowing and voice change.   Respiratory:  Negative for shortness of breath.   Gastrointestinal:  Negative for nausea.    Physical Exam Updated Vital Signs BP 124/82 (BP Location: Left Arm)   Pulse 72   Temp 98 F (36.7 C)   Resp 18   Ht 5\' 8"  (1.727 m)   Wt 80.7 kg   SpO2 100%   BMI 27.06 kg/m  Physical Exam Vitals and nursing note reviewed.  Constitutional:      General: He is not in acute distress.    Appearance: He is well-developed.  HENT:     Head: Normocephalic and atraumatic.     Mouth/Throat:     Mouth: Mucous membranes are moist. No oral lesions.     Dentition: Abnormal dentition. Dental tenderness, dental caries and dental abscesses present.     Tongue: Tongue does not deviate from midline.     Pharynx: No pharyngeal swelling, posterior oropharyngeal erythema or uvula swelling.     Tonsils: No tonsillar abscesses.   Eyes:     Conjunctiva/sclera: Conjunctivae normal.  Cardiovascular:     Rate and Rhythm: Normal  rate and regular rhythm.     Heart sounds: No murmur heard. Pulmonary:     Effort: Pulmonary effort is normal. No respiratory distress.     Breath sounds: Normal breath sounds.  Abdominal:     Palpations: Abdomen is soft.     Tenderness: There is no abdominal tenderness.  Musculoskeletal:        General: No swelling.     Cervical back: Neck supple.  Skin:    General: Skin is warm and dry.     Capillary Refill: Capillary refill takes less than 2 seconds.  Neurological:     Mental Status: He is alert.  Psychiatric:        Mood and Affect: Mood normal.      ED Results / Procedures / Treatments   Labs (all labs ordered are listed, but only abnormal results are displayed) Labs Reviewed - No data to display  EKG None  Radiology No results found.  Procedures Procedures    Medications Ordered in ED Medications  ketorolac (TORADOL) 30 MG/ML injection 30 mg (has no administration in time range)    ED Course/ Medical Decision Making/ A&P                           Medical Decision Making  Patient presents to the emergency department with a chief complaint of left-sided dental pain.  Patient states he has not seen a dentist in a few years.  He states he has cracked teeth and other dental problems that he plans to have addressed an upcoming dental appointment this week.  He states that the pain is increased over the past few days and he thinks he needs antibiotics and something for pain.  Patient has no voice changes, normal phonation, no tonsillar exudate or swelling.  No sign at this time of peritonsillar abscess or retropharyngeal abscess.  Presentation is consistent with poor dentition with missing teeth and abscess.  Patient's vitals are normal.  He is not septic.  Plan to give patient a Toradol injection while here at the hospital as this has provided relief in the past and prescribed antibiotics for dental infection coverage.  Patient will follow-up with dentistry this week for definitive management.        Final Clinical Impression(s) / ED Diagnoses Final diagnoses:  Dental infection    Rx / DC Orders ED Discharge Orders          Ordered    clindamycin (CLEOCIN) 150 MG capsule  Every 6 hours        11/15/21 1644    naproxen (NAPROSYN) 375 MG tablet  2 times daily        11/15/21 1644              Pamala Duffel 11/15/21 1645    [place], Adam, DO 11/15/21 1822

## 2021-12-16 ENCOUNTER — Emergency Department (HOSPITAL_COMMUNITY)
Admission: EM | Admit: 2021-12-16 | Discharge: 2021-12-16 | Disposition: A | Discharge status: Home / Self Care | Payer: BLUE CROSS/BLUE SHIELD | Attending: Emergency Medicine | Admitting: Emergency Medicine

## 2021-12-16 ENCOUNTER — Other Ambulatory Visit: Payer: Self-pay

## 2021-12-16 DIAGNOSIS — Z5321 Procedure and treatment not carried out due to patient leaving prior to being seen by health care provider: Secondary | ICD-10-CM | POA: Diagnosis not present

## 2021-12-16 DIAGNOSIS — K0889 Other specified disorders of teeth and supporting structures: Secondary | ICD-10-CM | POA: Diagnosis present

## 2021-12-16 NOTE — ED Triage Notes (Signed)
Pt. Stated, ive had a bad tooth on the left bottom for 2 weeks.

## 2021-12-16 NOTE — ED Notes (Signed)
PT was called for Vitals and did not respond

## 2022-01-31 ENCOUNTER — Ambulatory Visit
Admission: EM | Admit: 2022-01-31 | Discharge: 2022-01-31 | Disposition: A | Discharge status: Home / Self Care | Payer: BLUE CROSS/BLUE SHIELD | Attending: Urgent Care | Admitting: Urgent Care

## 2022-01-31 DIAGNOSIS — J309 Allergic rhinitis, unspecified: Secondary | ICD-10-CM | POA: Diagnosis not present

## 2022-01-31 DIAGNOSIS — J069 Acute upper respiratory infection, unspecified: Secondary | ICD-10-CM | POA: Insufficient documentation

## 2022-01-31 DIAGNOSIS — R070 Pain in throat: Secondary | ICD-10-CM | POA: Insufficient documentation

## 2022-01-31 DIAGNOSIS — H9201 Otalgia, right ear: Secondary | ICD-10-CM | POA: Insufficient documentation

## 2022-01-31 DIAGNOSIS — F17200 Nicotine dependence, unspecified, uncomplicated: Secondary | ICD-10-CM | POA: Diagnosis present

## 2022-01-31 LAB — POCT RAPID STREP A (OFFICE): Rapid Strep A Screen: NEGATIVE

## 2022-01-31 MED ORDER — LEVOCETIRIZINE DIHYDROCHLORIDE 2.5 MG/5ML PO SOLN: 2.5000 mg | Freq: Every evening | ORAL | 1 refills | Status: DC

## 2022-01-31 MED ORDER — PREDNISONE 20 MG PO TABS
ORAL_TABLET | ORAL | 0 refills | Status: DC
Start: 2022-01-31 — End: 2023-07-05

## 2022-01-31 NOTE — ED Provider Notes (Signed)
[facility]  Note:  This document was prepared using Conservation officer, historic buildings and may include unintentional dictation errors.  MRN: [id number] DOB: 07/14/1985  Subjective:   Darius Bailey is a 36 y.o. male presenting for 2-day history of acute onset persistent throat pain, right ear pain, sinus drainage.  Patient smokes cigarettes daily and is vaping in an effort to cut back on his smoking.  Has a history of allergies and bronchitis.  Does not take anything consistently for this.  Does not want a COVID test as he does not feel like he has COVID-19.  Wanted to be checked for strep.  No current facility-administered medications for this encounter.  Current Outpatient Medications:    albuterol (VENTOLIN HFA) 108 (90 Base) MCG/ACT inhaler, Inhale 2 puffs into the lungs every 6 (six) hours as needed for wheezing or shortness of breath (Cough). (Patient not taking: Reported on 10/22/2021), Disp: 18 g, Rfl: 1   clindamycin (CLEOCIN) 150 MG capsule, Take 1 capsule (150 mg total) by mouth every 6 (six) hours., Disp: 28 capsule, Rfl: 0   fexofenadine (ALLEGRA) 180 MG tablet, Take 1 tablet (180 mg total) by mouth daily. (Patient not taking: Reported on 10/22/2021), Disp: 180 tablet, Rfl: 0   fluticasone (FLONASE) 50 MCG/ACT nasal spray, Place 2 sprays into both nostrils daily. (Patient not taking: Reported on 10/22/2021), Disp: 64 mL, Rfl: 1   ipratropium (ATROVENT) 0.06 % nasal spray, Place 2 sprays into both nostrils 3 (three) times daily. As needed for nasal congestion, runny nose (Patient not taking: Reported on 10/22/2021), Disp: 15 mL, Rfl: 1   naproxen (NAPROSYN) 375 MG tablet, Take 1 tablet (375 mg total) by mouth 2 (two) times daily., Disp: 20 tablet, Rfl: 0   promethazine-dextromethorphan (PROMETHAZINE-DM) 6.25-15 MG/5ML syrup, Take 5 mLs by mouth 4 (four) times daily as needed for cough. (Patient not taking: Reported on 10/22/2021), Disp: 180 mL, Rfl: 0    Allergies  Allergen Reactions   Amoxil [Amoxicillin] Other (See Comments)    Unknown reaction - childhood allergy   Penicillins Other (See Comments)    Unknown reaction - childhood allergy  Has patient had a PCN reaction causing immediate rash, facial/tongue/throat swelling, SOB or lightheadedness with hypotension: Unknown Has patient had a PCN reaction causing severe rash involving mucus membranes or skin necrosis: Unknown Has patient had a PCN reaction that required hospitalization: Unknown Has patient had a PCN reaction occurring within the last 10 years: Unknown If all of the above answers are "NO", then may proceed with Cephalosporin use.;     Past Medical History:  Diagnosis Date   Bronchitis      Past Surgical History:  Procedure Laterality Date   NO PAST SURGERIES     PERCUTANEOUS PINNING  07/15/2011   Procedure: PERCUTANEOUS PINNING EXTREMITY;  Surgeon: Marlowe Shores, MD;  Location: [facility] OR;  Service: Orthopedics;  Laterality: Right;  right ring and small metacarpal    Family History  Problem Relation Age of Onset   Hypertension Father    Anesthesia problems Neg Hx     Social History   Tobacco Use   Smoking status: Every Day    Types: Cigarettes   Smokeless tobacco: Never  Vaping Use   Vaping Use: Some days  Substance Use Topics   Alcohol use: Yes    Comment: occ   Drug use: Yes    Types: Marijuana    ROS   Objective:   Vitals: BP 124/86 (BP  Location: Left Arm)   Pulse 77   Temp 98.9 F (37.2 C) (Oral)   Resp 18   SpO2 97%   Physical Exam Constitutional:      General: He is not in acute distress.    Appearance: Normal appearance. He is well-developed and normal weight. He is not ill-appearing, toxic-appearing or diaphoretic.  HENT:     Head: Normocephalic and atraumatic.     Right Ear: Tympanic membrane, ear canal and external ear normal. No drainage, swelling or tenderness. No middle ear effusion. There is no impacted cerumen. Tympanic  membrane is not erythematous or bulging.     Left Ear: Tympanic membrane, ear canal and external ear normal. No drainage, swelling or tenderness.  No middle ear effusion. There is no impacted cerumen. Tympanic membrane is not erythematous or bulging.     Nose: Congestion present. No rhinorrhea.     Mouth/Throat:     Mouth: Mucous membranes are moist.     Pharynx: No pharyngeal swelling, oropharyngeal exudate, posterior oropharyngeal erythema or uvula swelling.     Comments: Thick streaks of postnasal drainage overlying pharynx. Eyes:     General: No scleral icterus.       Right eye: No discharge.        Left eye: No discharge.     Extraocular Movements: Extraocular movements intact.     Conjunctiva/sclera: Conjunctivae normal.  Cardiovascular:     Rate and Rhythm: Normal rate and regular rhythm.     Heart sounds: Normal heart sounds. No murmur heard.    No friction rub. No gallop.  Pulmonary:     Effort: Pulmonary effort is normal. No respiratory distress.     Breath sounds: Normal breath sounds. No stridor. No wheezing, rhonchi or rales.  Musculoskeletal:     Cervical back: Normal range of motion and neck supple. No rigidity. No muscular tenderness.  Neurological:     General: No focal deficit present.     Mental Status: He is alert and oriented to person, place, and time.  Psychiatric:        Mood and Affect: Mood normal.        Behavior: Behavior normal.        Thought Content: Thought content normal.     Results for orders placed or performed during the hospital encounter of 01/31/22 (from the past 24 hour(s))  POCT rapid strep A     Status: None   Collection Time: 01/31/22  4:05 PM  Result Value Ref Range   Rapid Strep A Screen Negative Negative    Assessment and Plan :   PDMP not reviewed this encounter.  1. Viral upper respiratory infection   2. Throat pain   3. Right ear pain   4. Allergic rhinitis, unspecified seasonality, unspecified trigger   5. Smoking      Deferred imaging given clear cardiopulmonary exam, hemodynamically stable vital signs.  Strep culture pending.  Suspect viral URI, viral syndrome.  This is likely made worse by his smoking and therefore recommended a oral prednisone course.  Start Xyzal daily to address untreated allergic rhinitis.  Continue efforts at quitting smoking.  Otherwise, physical exam findings reassuring and vital signs stable for discharge. Advised supportive care, offered symptomatic relief. Counseled patient on potential for adverse effects with medications prescribed/recommended today, ER and return-to-clinic precautions discussed, patient verbalized understanding.     Wallis Bamberg, PA-C 01/31/22 1659

## 2022-01-31 NOTE — ED Triage Notes (Signed)
Pt c/o sore throat x 2 days-NAD-steady gait 

## 2022-02-02 LAB — CULTURE, GROUP A STREP (THRC)

## 2022-11-28 IMAGING — DX DG CHEST 2V
2 series · 2 of 2 positions shown · non-contrast
Comparison: 12/30/2018

CLINICAL DATA: Possible toxic exposure. Pneumonitis with dyspnea
and cough.

EXAM:
CHEST - 2 VIEW

[chest pa]
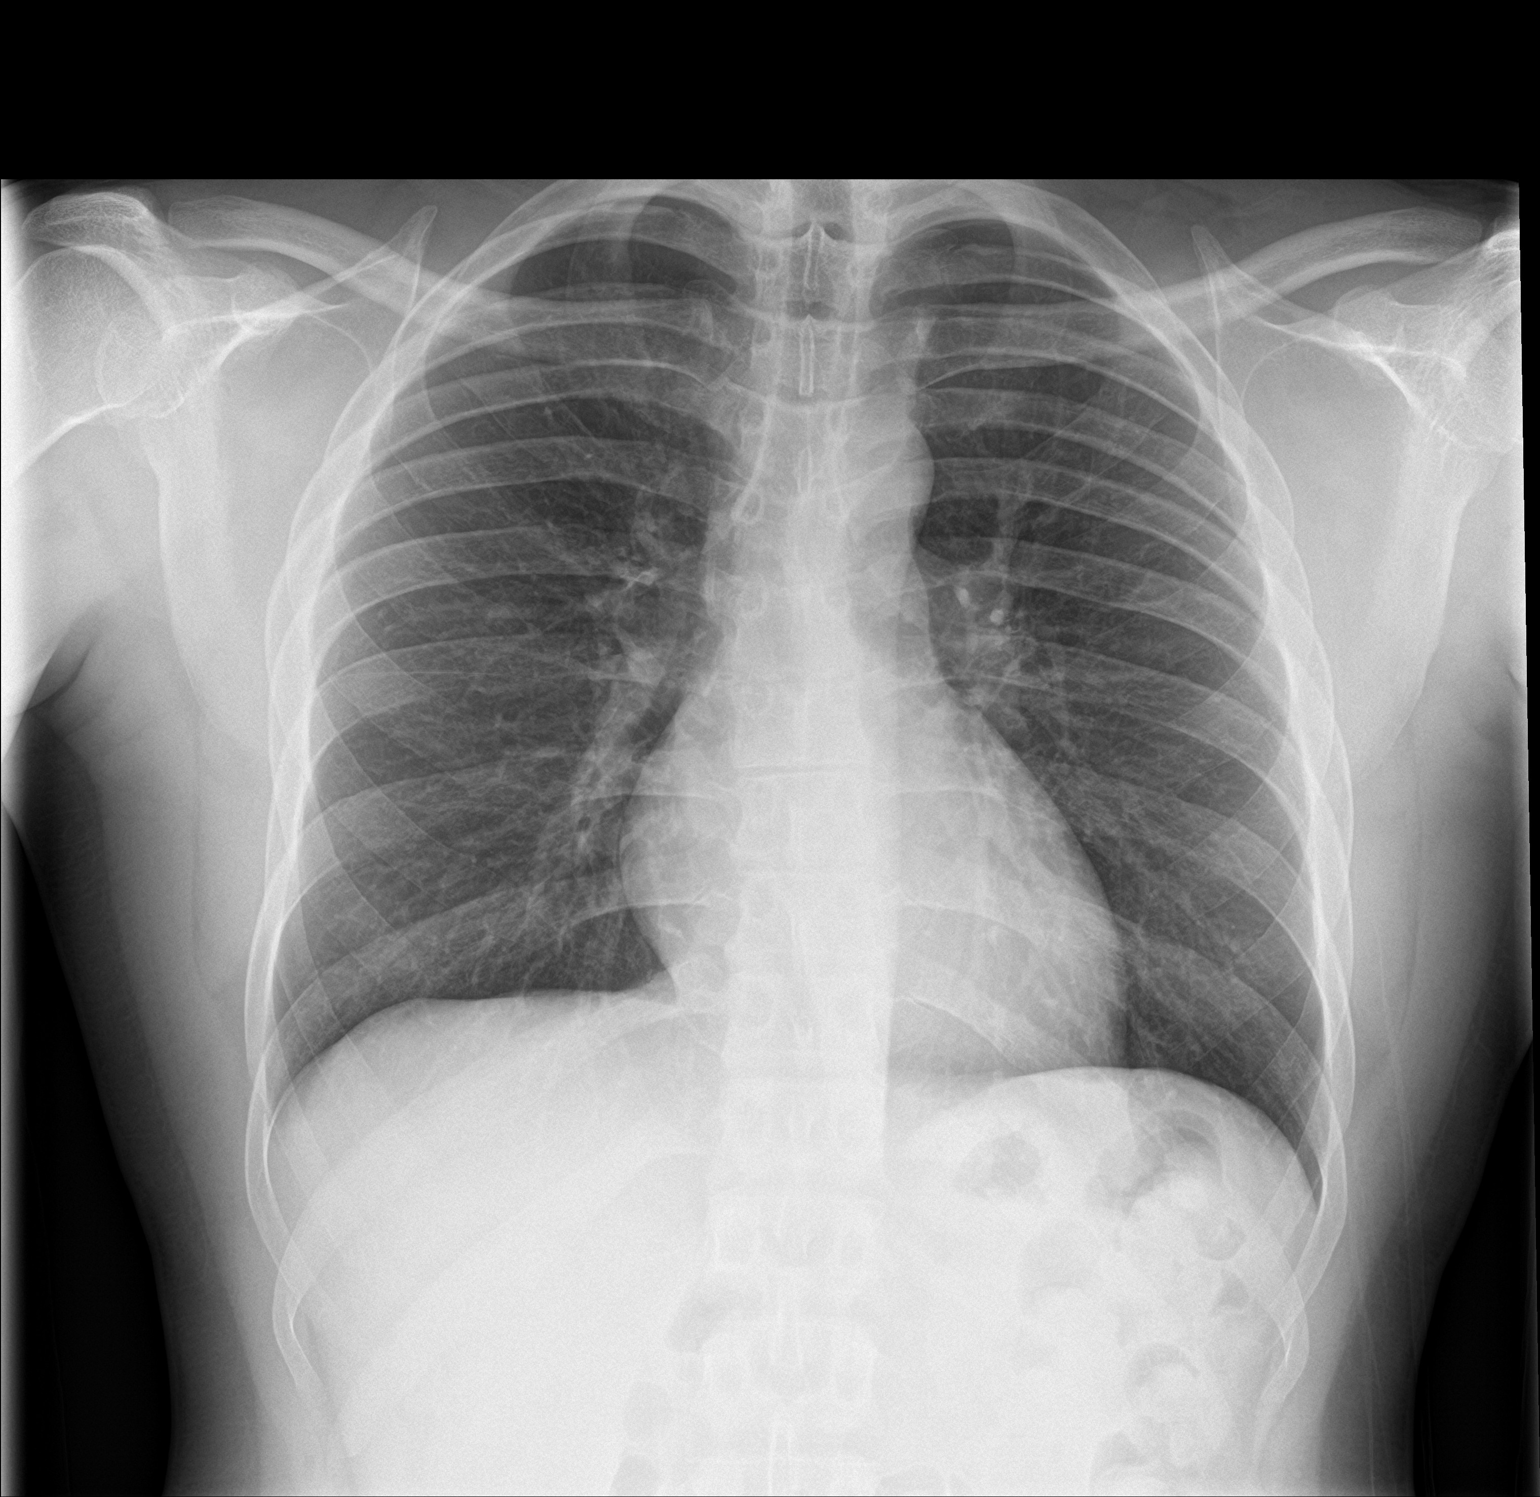

[chest lat]
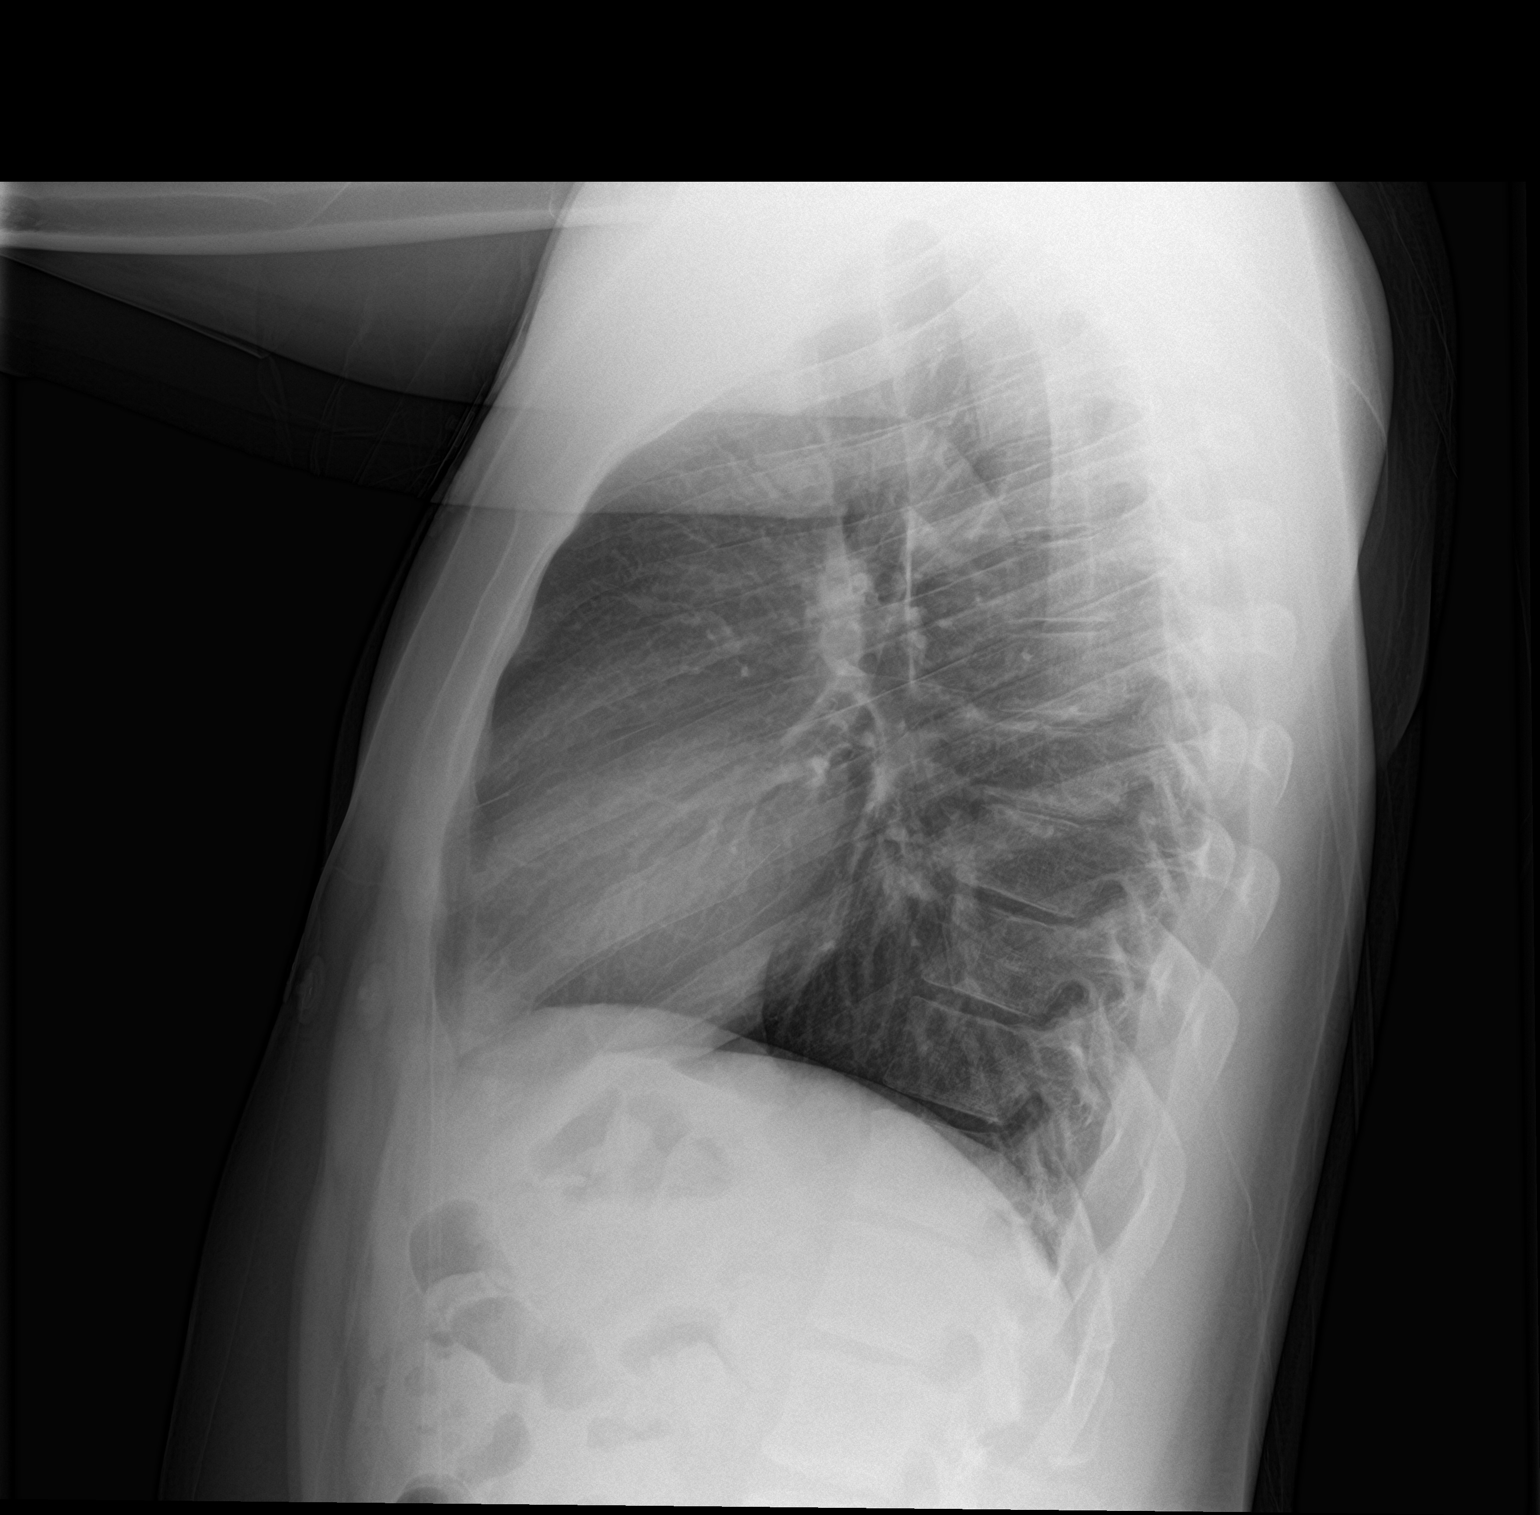

[2 of 2 positions shown; findings below may reference images not displayed]

FINDINGS: Normal heart size and mediastinal contours. No acute infiltrate or
edema. No effusion or pneumothorax. No acute osseous findings.
IMPRESSION: Negative chest.

## 2023-06-22 ENCOUNTER — Emergency Department (HOSPITAL_COMMUNITY)
Admission: EM | Admit: 2023-06-22 | Discharge: 2023-06-22 | Disposition: A | Discharge status: Home / Self Care | Payer: Self-pay | Attending: Emergency Medicine | Admitting: Emergency Medicine

## 2023-06-22 ENCOUNTER — Other Ambulatory Visit: Payer: Self-pay

## 2023-06-22 ENCOUNTER — Emergency Department (HOSPITAL_COMMUNITY): Payer: Self-pay

## 2023-06-22 DIAGNOSIS — R569 Unspecified convulsions: Secondary | ICD-10-CM | POA: Insufficient documentation

## 2023-06-22 LAB — TROPONIN I (HIGH SENSITIVITY): Troponin I (High Sensitivity): 3 ng/L (ref <18)

## 2023-06-22 LAB — BASIC METABOLIC PANEL WITH GFR
Anion gap: 5 (ref 5–15)
BUN: 11 mg/dL (ref 6–20)
CO2: 24 mmol/L (ref 22–32)
Calcium: 8.8 mg/dL — ABNORMAL LOW (ref 8.9–10.3)
Chloride: 107 mmol/L (ref 98–111)
Creatinine, Ser: 1.17 mg/dL (ref 0.61–1.24)
GFR, Estimated: >60 mL/min (ref >60)
Glucose, Bld: 93 mg/dL (ref 70–99)
Potassium: 4.3 mmol/L (ref 3.5–5.1)
Sodium: 136 mmol/L (ref 135–145)

## 2023-06-22 LAB — CBC
HCT: 43 % (ref 39.0–52.0)
Hemoglobin: 14.3 g/dL (ref 13.0–17.0)
MCH: 31.2 pg (ref 26.0–34.0)
MCHC: 33.3 g/dL (ref 30.0–36.0)
MCV: 93.7 fL (ref 80.0–100.0)
Platelets: 307 10*3/uL (ref 150–400)
RBC: 4.59 MIL/uL (ref 4.22–5.81)
RDW: 11.9 % (ref 11.5–15.5)
WBC: 7.6 10*3/uL (ref 4.0–10.5)
nRBC: 0 % (ref 0.0–0.2)

## 2023-06-22 LAB — CBG MONITORING, ED: Glucose-Capillary: 100 mg/dL — ABNORMAL HIGH (ref 70–99)

## 2023-06-22 NOTE — Discharge Instructions (Signed)
 Call the neurologist today to schedule a follow-up visit.  Do not drive or operate heavy machinery until you are cleared by them

## 2023-06-22 NOTE — ED Triage Notes (Signed)
 Pt arrived via POV. C/o possible seizure. Pt was sitting in chair sleeping then fell to the ground stiff. When they came to they were fatigued and confused. Pt c/o head, neck pain, and tongue pain.  No hx seizures

## 2023-06-22 NOTE — ED Provider Notes (Signed)
 [place] EMERGENCY DEPARTMENT AT [facility] Provider Note   CSN: [id number] Arrival date & time: 06/22/23  1028     History  Chief Complaint  Patient presents with   Seizures    Darius Bailey is a 38 y.o. male.  38 year old male presents after possible seizure just prior to arrival.  States that he was sitting at home and then he woke up to his girlfriend standing over him.  States he bit his tongue.  Did not lose control of his bladder.  Similar episode of passing out before in the past was seen in the hospital and no etiology found.  States that he has not had any recent headaches.  Did not have any chest pain prior to the event.  Girlfriend describes about a 5-minute postictal period of confusion afterwards.  He is not back to his baseline at this time.  Does note that when this occurred prior it occurred when he went from sitting to standing       Home Medications Prior to Admission medications   Medication Sig Start Date End Date Taking? Authorizing Provider  albuterol (VENTOLIN HFA) 108 (90 Base) MCG/ACT inhaler Inhale 2 puffs into the lungs every 6 (six) hours as needed for wheezing or shortness of breath (Cough). Patient not taking: Reported on 10/22/2021 06/02/21   Theadora Rama Scales, PA-C  clindamycin (CLEOCIN) 150 MG capsule Take 1 capsule (150 mg total) by mouth every 6 (six) hours. 11/15/21   Darrick Grinder, PA-C  fexofenadine (ALLEGRA) 180 MG tablet Take 1 tablet (180 mg total) by mouth daily. Patient not taking: Reported on 10/22/2021 06/02/21 11/29/21  Theadora Rama Scales, PA-C  fluticasone [facility]) 50 MCG/ACT nasal spray Place 2 sprays into both nostrils daily. Patient not taking: Reported on 10/22/2021 06/02/21   Theadora Rama Scales, PA-C  ipratropium (ATROVENT) 0.06 % nasal spray Place 2 sprays into both nostrils 3 (three) times daily. As needed for nasal congestion, runny nose Patient not taking: Reported on 10/22/2021 06/02/21   Theadora Rama Scales, PA-C  levocetirizine (XYZAL) 2.5 MG/5ML solution Take 5 mLs (2.5 mg total) by mouth every evening. 01/31/22   Wallis Bamberg, PA-C  naproxen (NAPROSYN) 375 MG tablet Take 1 tablet (375 mg total) by mouth 2 (two) times daily. 11/15/21   Darrick Grinder, PA-C  predniSONE (DELTASONE) 20 MG tablet Take 2 tablets daily with breakfast. 01/31/22   Wallis Bamberg, PA-C  promethazine-dextromethorphan (PROMETHAZINE-DM) 6.25-15 MG/5ML syrup Take 5 mLs by mouth 4 (four) times daily as needed for cough. Patient not taking: Reported on 10/22/2021 06/02/21   Theadora Rama Scales, PA-C      Allergies    Amoxil [amoxicillin] and Penicillins    Review of Systems   Review of Systems  All other systems reviewed and are negative.   Physical Exam Updated Vital Signs BP 137/80 (BP Location: Left Arm)   Pulse (!) 58   Temp 99 F (37.2 C) (Oral)   Resp 13   Ht 1.727 m (5\' 8" )   Wt 83 kg   SpO2 96%   BMI 27.83 kg/m  Physical Exam Vitals and nursing note reviewed.  Constitutional:      General: He is not in acute distress.    Appearance: Normal appearance. He is well-developed. He is not toxic-appearing.  HENT:     Head: Normocephalic and atraumatic.  Eyes:     General: Lids are normal.     Conjunctiva/sclera: Conjunctivae normal.     Pupils: Pupils  are equal, round, and reactive to light.  Neck:     Thyroid: No thyroid mass.     Trachea: No tracheal deviation.  Cardiovascular:     Rate and Rhythm: Normal rate and regular rhythm.     Heart sounds: Normal heart sounds. No murmur heard.    No gallop.  Pulmonary:     Effort: Pulmonary effort is normal. No respiratory distress.     Breath sounds: Normal breath sounds. No stridor. No decreased breath sounds, wheezing, rhonchi or rales.  Abdominal:     General: There is no distension.     Palpations: Abdomen is soft.     Tenderness: There is no abdominal tenderness. There is no rebound.  Musculoskeletal:        General: No tenderness.  Normal range of motion.     Cervical back: Normal range of motion and neck supple.  Skin:    General: Skin is warm and dry.     Findings: No abrasion or rash.  Neurological:     Mental Status: He is alert and oriented to person, place, and time. Mental status is at baseline.     GCS: GCS eye subscore is 4. GCS verbal subscore is 5. GCS motor subscore is 6.     Cranial Nerves: No cranial nerve deficit.     Sensory: No sensory deficit.     Motor: Motor function is intact.  Psychiatric:        Attention and Perception: Attention normal.        Speech: Speech normal.        Behavior: Behavior normal.     ED Results / Procedures / Treatments   Labs (all labs ordered are listed, but only abnormal results are displayed) Labs Reviewed  CBG MONITORING, ED - Abnormal; Notable for the following components:      Result Value   Glucose-Capillary 100 (*)    All other components within normal limits  BASIC METABOLIC PANEL WITH GFR  CBC  TROPONIN I (HIGH SENSITIVITY)    EKG EKG Interpretation Date/Time:  Thursday June 22 2023 10:39:20 EDT Ventricular Rate:  63 PR Interval:  185 QRS Duration:  97 QT Interval:  414 QTC Calculation: 424 R Axis:   63  Text Interpretation: Sinus arrhythmia ST elev, probable normal early repol pattern Confirmed by Lorre Nick ([id number]) on 06/22/2023 11:11:26 AM  Radiology No results found.  Procedures Procedures    Medications Ordered in ED Medications - No data to display  ED Course/ Medical Decision Making/ A&P                                 Medical Decision Making Amount and/or Complexity of Data Reviewed Labs: ordered. Radiology: ordered.   Patient is EKG shows sinus rhythm.  QT not severely prolongated.  Labs here are reassuring.  Head CT without acute abnormality.  Suspect that patient may have had a seizure.  Will give seizure precautions and give referral to neurology.  Patient Seizure precautions that he should not drive or  operate heavy equipment       Final Clinical Impression(s) / ED Diagnoses Final diagnoses:  None    Rx / DC Orders ED Discharge Orders     None         Lorre Nick, MD 06/22/23 1326

## 2023-07-05 ENCOUNTER — Ambulatory Visit (INDEPENDENT_AMBULATORY_CARE_PROVIDER_SITE_OTHER): Payer: Self-pay | Admitting: Neurology

## 2023-07-05 ENCOUNTER — Encounter: Payer: Self-pay | Admitting: Neurology

## 2023-07-05 VITALS — BP 110/72 | HR 83 | Ht 67.0 in | Wt 186.0 lb

## 2023-07-05 DIAGNOSIS — G40301 Generalized idiopathic epilepsy and epileptic syndromes, not intractable, with status epilepticus: Secondary | ICD-10-CM | POA: Insufficient documentation

## 2023-07-05 MED ORDER — DIVALPROEX SODIUM ER 500 MG PO TB24: 500.0000 mg | ORAL_TABLET | Freq: Every day | ORAL | 11 refills | Status: DC
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
## 2023-07-11 ENCOUNTER — Telehealth: Payer: Self-pay | Admitting: Neurology

## 2023-07-11 NOTE — Telephone Encounter (Signed)
 Checking is he has his insurance information now so he isn't self-pay.

## 2024-01-07 ENCOUNTER — Emergency Department (HOSPITAL_COMMUNITY)
Admission: EM | Admit: 2024-01-07 | Discharge: 2024-01-07 | Disposition: A | Discharge status: Home / Self Care | Payer: Self-pay

## 2024-01-07 ENCOUNTER — Emergency Department (HOSPITAL_COMMUNITY): Payer: Self-pay

## 2024-01-07 ENCOUNTER — Encounter (HOSPITAL_COMMUNITY): Payer: Self-pay

## 2024-01-07 ENCOUNTER — Other Ambulatory Visit: Payer: Self-pay

## 2024-01-07 DIAGNOSIS — N289 Disorder of kidney and ureter, unspecified: Secondary | ICD-10-CM | POA: Insufficient documentation

## 2024-01-07 DIAGNOSIS — K769 Liver disease, unspecified: Secondary | ICD-10-CM | POA: Diagnosis not present

## 2024-01-07 DIAGNOSIS — R319 Hematuria, unspecified: Secondary | ICD-10-CM | POA: Diagnosis not present

## 2024-01-07 LAB — COMPREHENSIVE METABOLIC PANEL WITH GFR
ALT: 36 U/L (ref 0–44)
AST: 23 U/L (ref 15–41)
Albumin: 4.1 g/dL (ref 3.5–5.0)
Alkaline Phosphatase: 78 U/L (ref 38–126)
Anion gap: 10 (ref 5–15)
BUN: 8 mg/dL (ref 6–20)
CO2: 27 mmol/L (ref 22–32)
Calcium: 9.4 mg/dL (ref 8.9–10.3)
Chloride: 101 mmol/L (ref 98–111)
Creatinine, Ser: 1.28 mg/dL — ABNORMAL HIGH (ref 0.61–1.24)
GFR, Estimated: >60 mL/min (ref >60)
Glucose, Bld: 116 mg/dL — ABNORMAL HIGH (ref 70–99)
Potassium: 4.9 mmol/L (ref 3.5–5.1)
Sodium: 138 mmol/L (ref 135–145)
Total Bilirubin: 1 mg/dL (ref 0.0–1.2)
Total Protein: 7.2 g/dL (ref 6.5–8.1)

## 2024-01-07 LAB — CBC WITH DIFFERENTIAL/PLATELET
Abs Immature Granulocytes: 0.01 K/uL (ref 0.00–0.07)
Basophils Absolute: 0 K/uL (ref 0.0–0.1)
Basophils Relative: 0 %
Eosinophils Absolute: 0.1 K/uL (ref 0.0–0.5)
Eosinophils Relative: 2 %
HCT: 44.4 % (ref 39.0–52.0)
Hemoglobin: 14.8 g/dL (ref 13.0–17.0)
Immature Granulocytes: 0 %
Lymphocytes Relative: 30 %
Lymphs Abs: 2 K/uL (ref 0.7–4.0)
MCH: 31.4 pg (ref 26.0–34.0)
MCHC: 33.3 g/dL (ref 30.0–36.0)
MCV: 94.1 fL (ref 80.0–100.0)
Monocytes Absolute: 0.7 K/uL (ref 0.1–1.0)
Monocytes Relative: 11 %
Neutro Abs: 3.9 K/uL (ref 1.7–7.7)
Neutrophils Relative %: 57 %
Platelets: 298 K/uL (ref 150–400)
RBC: 4.72 MIL/uL (ref 4.22–5.81)
RDW: 12.2 % (ref 11.5–15.5)
WBC: 6.8 K/uL (ref 4.0–10.5)
nRBC: 0 % (ref 0.0–0.2)

## 2024-01-07 LAB — URINALYSIS, ROUTINE W REFLEX MICROSCOPIC
Bilirubin Urine: NEGATIVE
Glucose, UA: NEGATIVE mg/dL
Ketones, ur: NEGATIVE mg/dL
Leukocytes,Ua: NEGATIVE
Nitrite: NEGATIVE
Protein, ur: NEGATIVE mg/dL
Specific Gravity, Urine: 1.012 (ref 1.005–1.030)
pH: 6 (ref 5.0–8.0)

## 2024-01-07 NOTE — ED Notes (Signed)
 Pt denies fever, chills, and flank pain at this time.

## 2024-01-07 NOTE — ED Provider Triage Note (Signed)
 Emergency Medicine Provider Triage Evaluation Note  Darius Bailey , a 38 y.o. male  was evaluated in triage.  Pt complains of dysuria with associated flank pain.  Has history of nephrolithiasis.  Review of Systems  Positive:  Negative:   Physical Exam  BP 128/77 (BP Location: Right Arm)   Pulse (!) 57   Temp 98.1 F (36.7 C)   Resp 17   Ht 5' 8 (1.727 m)   Wt 82.6 kg   SpO2 98%   BMI 27.67 kg/m  Gen:   Awake, no distress   Resp:  Normal effort  MSK:   Moves extremities without difficulty  Other:    Medical Decision Making  Medically screening exam initiated at 8:39 AM.  Appropriate orders placed.  Darius Bailey was informed that the remainder of the evaluation will be completed by another provider, this initial triage assessment does not replace that evaluation, and the importance of remaining in the ED until their evaluation is complete.  Will obtain labs and renal study   Darius Bailey 01/07/24 1449

## 2024-01-07 NOTE — Discharge Instructions (Signed)
 Please drink plenty of fluids over the next few days and call the office at [facility] urology at the number provided.  It is very important to follow-up and have the MRI to make sure that the spots on your CT scan are not from cancer.  Please call tomorrow to schedule a follow-up appointment.  Return to the ER for worsening symptoms.

## 2024-01-07 NOTE — ED Triage Notes (Signed)
 Pt bib POV c/o Pt says starting yesterday he a burning sensation and bladder pain. Pt states he had pinkish tint to his urine.   Pt states a year ago he had blood in his underwear and when he went to the doctor he was told he was passing a kidney stone. CT scan showed a cyst on his liver.   Hx seizure (06/2023)

## 2024-01-07 NOTE — ED Provider Notes (Signed)
 [place] EMERGENCY DEPARTMENT AT [facility] Provider Note   CSN: [id number] Arrival date & time: 01/07/24  9597     Patient presents with: Urinary Tract Infection   Darius Bailey is a 38 y.o. male.   38 year old male with past medical history of ureteral stones in the past presenting to the emergency department today with hematuria.  Reports this been off and on now for the past few weeks.  He states that he felt that he had another kidney stone.  Denies any fevers.  He came to the ER today due to this.  He denies any nausea or vomiting.  He denies any passage of large clots or inability to urinate/hesitancy.   Urinary Tract Infection Associated symptoms: hematuria        Prior to Admission medications   Medication Sig Start Date End Date Taking? Authorizing Provider  albuterol  (VENTOLIN  HFA) 108 (90 Base) MCG/ACT inhaler Inhale 2 puffs into the lungs every 6 (six) hours as needed for wheezing or shortness of breath (Cough). 06/02/21   Joesph Shaver Scales, PA-C  divalproex  (DEPAKOTE  ER) 500 MG 24 hr tablet Take 1 tablet (500 mg total) by mouth daily. 07/05/23   Onita Duos, MD    Allergies: Amoxil [amoxicillin], Penicillin g, and Penicillins    Review of Systems  Genitourinary:  Positive for hematuria.  All other systems reviewed and are negative.   Updated Vital Signs BP 129/66 (BP Location: Right Arm)   Pulse 64   Temp 98 F (36.7 C) (Oral)   Resp 16   Ht 5' 8 (1.727 m)   Wt 82.6 kg   SpO2 100%   BMI 27.67 kg/m   Physical Exam Vitals and nursing note reviewed.   Gen: NAD Eyes: PERRL, EOMI HEENT: no oropharyngeal swelling Neck: trachea midline Resp: clear to auscultation bilaterally Card: RRR, no murmurs, rubs, or gallops Abd: nontender, nondistended Extremities: no calf tenderness, no edema Vascular: 2+ radial pulses bilaterally, 2+ DP pulses bilaterally Skin: no rashes Psyc: acting appropriately   (all labs ordered are listed, but  only abnormal results are displayed) Labs Reviewed  URINALYSIS, ROUTINE W REFLEX MICROSCOPIC - Abnormal; Notable for the following components:      Result Value   Hgb urine dipstick MODERATE (*)    Bacteria, UA RARE (*)    All other components within normal limits  COMPREHENSIVE METABOLIC PANEL WITH GFR - Abnormal; Notable for the following components:   Glucose, Bld 116 (*)    Creatinine, Ser 1.28 (*)    All other components within normal limits  URINE CULTURE  CBC WITH DIFFERENTIAL/PLATELET  GC/CHLAMYDIA PROBE AMP ([place]) NOT AT [facility]    EKG: None  Radiology: CT Renal Stone Study Result Date: 01/07/2024 EXAM: CT UROGRAM 01/07/2024 09:17:57 AM TECHNIQUE: CT of the abdomen and pelvis was performed WITHOUT the administration of intravenous contrast as per CT urogram protocol. Multiplanar reformatted images as well as MIP urogram images are provided for review. Automated exposure control, iterative reconstruction, and/or weight based adjustment of the mA/kV was utilized to reduce the radiation dose to as low as reasonably achievable. COMPARISON: None available. CLINICAL HISTORY: 38 year old male. Abdominal/flank pain, stone suspected. Burning sensation and bladder pain. Pinkish tint to urine. History of passing kidney stone and liver cyst. FINDINGS: LOWER CHEST: No acute abnormality. LIVER: Round and lobulated indistinct central liver mass with slightly indistinct margins measures about 7.3 x 6.8 x 5.6 cm in certain dimensions. No hepatomegaly. There is a second  smaller similar mildly indistinct 2.1 cm mass in the inferior right hepatic lobe on series 2 image 21. Patient reports a history of 'cyst on liver'. Nonemergent follow up abdomen MRI without and with contrast (renal mass protocol) recommended to best characterize both the liver and right kidney lesions. GALLBLADDER AND BILE DUCTS: Gallbladder is unremarkable. No biliary ductal dilatation. SPLEEN: No acute abnormality. PANCREAS: No  acute abnormality. ADRENAL GLANDS: No acute abnormality. KIDNEYS, URETERS AND BLADDER: Exophytic and partially calcified intermediate density right renal mid to upper pole mass measures up to 5.3 cm long axis on coronal image 88. No superimposed nephrolithiasis, hydronephrosis, or evidence of active renal inflammation. Ureters are symmetric and diminutive. Fully decompressed bladder. Occasional pelvic phleboliths. Nonemergent follow up abdomen MRI without and with contrast (renal mass protocol) recommended to best characterize both the liver and right kidney lesions. GI AND BOWEL: Stomach demonstrates no acute abnormality. There is no bowel obstruction. Normal gas containing appendix on series 2 image 58. PERITONEUM AND RETROPERITONEUM: No ascites. No free air. VASCULATURE: Aorta is normal in caliber. Faint calcified atherosclerosis of the abdominal aorta, aortoiliac bifurcation. LYMPH NODES: No lymphadenopathy. REPRODUCTIVE ORGANS: No acute abnormality. BONES AND SOFT TISSUES: No acute osseous abnormality. No focal soft tissue abnormality. IMPRESSION: 1. Exophytic partially calcified and indeterminate right renal mass (5.3 cm). Large central liver lesion (7.3 cm) with a second smaller right lobe lesion 2.1 cm. 2. follow up abdomen MRI without and with contrast (renal mass protocol) recommended to best characterize both the liver and right kidney lesions. 3. No superimposed nephrolithiasis, obstructive uropathy, or other acute/inflammatory finding in the non-contrast abdomen and pelvis. Electronically signed by: Helayne Hurst MD 01/07/2024 09:44 AM EDT RP Workstation: [id number]     Procedures   Medications Ordered in the ED - No data to display                                  Medical Decision Making 38 year old male presents to the emergency department today with hematuria and some intermittent flank discomfort over the past few weeks.  Will further evaluate him here with basic labs as well as a CT scan  without contrast evaluate for ureteral stones.  He denies any pain currently.  Will reevaluate for ultimate disposition.  The patient's CT scan does show some concerning renal and hepatic lesions.  I did call and discussed this with Dr. Dorette.  Recommends outpatient follow-up for MRI and further evaluation.  I did discuss this with the patient and did inform him that these findings could be indicative of malignancy and that outpatient follow-up is very important.  The patient understands and will follow-up with urology as an outpatient.  The patient be discharged with return precautions.  Amount and/or Complexity of Data Reviewed Labs: ordered.        Final diagnoses:  Hematuria, unspecified type  Renal lesion  Liver lesion    ED Discharge Orders     None          Ula Prentice SAUNDERS, MD 01/07/24 1125

## 2024-01-08 LAB — URINE CULTURE: Culture: NO GROWTH

## 2024-01-08 LAB — GC/CHLAMYDIA PROBE AMP (~~LOC~~) NOT AT ARMC
Chlamydia: NEGATIVE
Comment: NEGATIVE
Comment: NORMAL
Neisseria Gonorrhea: NEGATIVE

## 2024-01-23 DIAGNOSIS — C787 Secondary malignant neoplasm of liver and intrahepatic bile duct: Secondary | ICD-10-CM | POA: Diagnosis not present

## 2024-01-23 DIAGNOSIS — D49511 Neoplasm of unspecified behavior of right kidney: Secondary | ICD-10-CM | POA: Diagnosis not present

## 2024-01-30 ENCOUNTER — Other Ambulatory Visit: Payer: Self-pay | Admitting: Urology

## 2024-01-30 DIAGNOSIS — D49511 Neoplasm of unspecified behavior of right kidney: Secondary | ICD-10-CM

## 2024-02-05 DIAGNOSIS — D49511 Neoplasm of unspecified behavior of right kidney: Secondary | ICD-10-CM | POA: Diagnosis not present

## 2024-02-07 ENCOUNTER — Other Ambulatory Visit (HOSPITAL_COMMUNITY): Payer: Self-pay | Admitting: Urology

## 2024-02-07 DIAGNOSIS — D49511 Neoplasm of unspecified behavior of right kidney: Secondary | ICD-10-CM

## 2024-02-13 ENCOUNTER — Encounter (HOSPITAL_COMMUNITY): Payer: Self-pay

## 2024-02-13 ENCOUNTER — Ambulatory Visit (HOSPITAL_COMMUNITY): Payer: Self-pay

## 2024-02-13 ENCOUNTER — Other Ambulatory Visit (HOSPITAL_COMMUNITY)

## 2024-02-20 ENCOUNTER — Ambulatory Visit (HOSPITAL_COMMUNITY): Admission: RE | Admit: 2024-02-20

## 2024-02-20 ENCOUNTER — Ambulatory Visit (HOSPITAL_COMMUNITY): Payer: Self-pay

## 2024-02-27 ENCOUNTER — Other Ambulatory Visit (HOSPITAL_COMMUNITY): Payer: Self-pay | Admitting: Radiology

## 2024-02-28 ENCOUNTER — Other Ambulatory Visit (HOSPITAL_COMMUNITY): Payer: Self-pay | Admitting: Urology

## 2024-02-28 ENCOUNTER — Ambulatory Visit (HOSPITAL_COMMUNITY): Payer: Self-pay

## 2024-02-28 ENCOUNTER — Ambulatory Visit (HOSPITAL_COMMUNITY): Payer: Self-pay | Attending: Urology

## 2024-02-28 DIAGNOSIS — Z0189 Encounter for other specified special examinations: Secondary | ICD-10-CM

## 2024-02-28 DIAGNOSIS — D49511 Neoplasm of unspecified behavior of right kidney: Secondary | ICD-10-CM

## 2024-02-29 ENCOUNTER — Encounter (HOSPITAL_COMMUNITY): Payer: Self-pay

## 2024-02-29 ENCOUNTER — Emergency Department (HOSPITAL_COMMUNITY)

## 2024-02-29 ENCOUNTER — Other Ambulatory Visit: Payer: Self-pay

## 2024-02-29 ENCOUNTER — Emergency Department (HOSPITAL_COMMUNITY)
Admission: EM | Admit: 2024-02-29 | Discharge: 2024-02-29 | Disposition: A | Discharge status: Home / Self Care | Attending: Emergency Medicine | Admitting: Emergency Medicine

## 2024-02-29 DIAGNOSIS — R55 Syncope and collapse: Secondary | ICD-10-CM | POA: Insufficient documentation

## 2024-02-29 DIAGNOSIS — R9431 Abnormal electrocardiogram [ECG] [EKG]: Secondary | ICD-10-CM | POA: Diagnosis not present

## 2024-02-29 DIAGNOSIS — R569 Unspecified convulsions: Secondary | ICD-10-CM | POA: Diagnosis present

## 2024-02-29 LAB — CBC
HCT: 49 % (ref 39.0–52.0)
Hemoglobin: 16.6 g/dL (ref 13.0–17.0)
MCH: 32 pg (ref 26.0–34.0)
MCHC: 33.9 g/dL (ref 30.0–36.0)
MCV: 94.6 fL (ref 80.0–100.0)
Platelets: 335 K/uL (ref 150–400)
RBC: 5.18 MIL/uL (ref 4.22–5.81)
RDW: 11.7 % (ref 11.5–15.5)
WBC: 9.7 K/uL (ref 4.0–10.5)
nRBC: 0 % (ref 0.0–0.2)

## 2024-02-29 LAB — URINALYSIS, ROUTINE W REFLEX MICROSCOPIC
Bilirubin Urine: NEGATIVE
Glucose, UA: NEGATIVE mg/dL
Ketones, ur: NEGATIVE mg/dL
Nitrite: NEGATIVE
Protein, ur: 30 mg/dL — AB
RBC / HPF: >50 RBC/hpf (ref 0–5)
Specific Gravity, Urine: 1.023 (ref 1.005–1.030)
pH: 6 (ref 5.0–8.0)

## 2024-02-29 LAB — COMPREHENSIVE METABOLIC PANEL WITH GFR
ALT: 25 U/L (ref 0–44)
AST: 26 U/L (ref 15–41)
Albumin: 3.9 g/dL (ref 3.5–5.0)
Alkaline Phosphatase: 75 U/L (ref 38–126)
Anion gap: 8 (ref 5–15)
BUN: 11 mg/dL (ref 6–20)
CO2: 24 mmol/L (ref 22–32)
Calcium: 8.8 mg/dL — ABNORMAL LOW (ref 8.9–10.3)
Chloride: 106 mmol/L (ref 98–111)
Creatinine, Ser: 1.4 mg/dL — ABNORMAL HIGH (ref 0.61–1.24)
GFR, Estimated: >60 mL/min (ref >60)
Glucose, Bld: 101 mg/dL — ABNORMAL HIGH (ref 70–99)
Potassium: 4.5 mmol/L (ref 3.5–5.1)
Sodium: 138 mmol/L (ref 135–145)
Total Bilirubin: 1 mg/dL (ref 0.0–1.2)
Total Protein: 5.8 g/dL — ABNORMAL LOW (ref 6.5–8.1)

## 2024-02-29 LAB — CBG MONITORING, ED: Glucose-Capillary: 109 mg/dL — ABNORMAL HIGH (ref 70–99)

## 2024-02-29 MED ORDER — SODIUM CHLORIDE 0.9 % IV BOLUS
1000.0000 mL | Freq: Once | INTRAVENOUS | Status: AC
  Administered 2024-02-29: 17:00:00 1000 mL via INTRAVENOUS

## 2024-02-29 MED ORDER — GADOBUTROL 1 MMOL/ML IV SOLN
7.5000 mL | Freq: Once | INTRAVENOUS | Status: AC | PRN
  Administered 2024-02-29: 21:00:00 7.5 mL via INTRAVENOUS

## 2024-02-29 MED ORDER — LEVETIRACETAM 500 MG PO TABS: 500.0000 mg | ORAL_TABLET | Freq: Two times a day (BID) | ORAL | 0 refills | Status: DC

## 2024-02-29 MED ORDER — LEVETIRACETAM (KEPPRA) 500 MG/5 ML ADULT IV PUSH
1000.0000 mg | Freq: Once | INTRAVENOUS | Status: AC
  Administered 2024-02-29: 17:00:00 1000 mg via INTRAVENOUS
  Filled 2024-02-29: qty 10

## 2024-02-29 NOTE — ED Notes (Signed)
 Patient transported to MRI

## 2024-02-29 NOTE — Discharge Instructions (Signed)
 You likely had a seizure today  I have prescribed Keppra  500 mg twice daily  Please contact your neurologist tomorrow to schedule for follow-up  No driving until cleared by neurology  Return to ER if you have another seizure or passing out
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
## 2024-03-01 ENCOUNTER — Telehealth: Payer: Self-pay | Admitting: Neurology

## 2024-03-01 NOTE — Telephone Encounter (Signed)
 Pt called to  Schedule Hospital follow up the patient was discharged from Hospital last night  and is requesting to be scheduled the patient also requested a callback from MD . Pt stated that last night   ER MD prescribe Pt with levETIRAcetam  (KEPPRA ) 500 MG tablet and Pt took medication , Pt is concern due to neurologist  HAS pt ON divalproex  (DEPAKOTE  ER) 500 MG 24 hr tablet . Pt wants to know which one should he take  and what would be a good choice for him .  Pt also stated that he has gotten MRI done at visit last night ,However he still need EEG . Pt is requesting callback

## 2024-03-01 NOTE — Telephone Encounter (Signed)
I called and LMVM for pt that returned call.  

## 2024-03-04 NOTE — Telephone Encounter (Signed)
 Called and spoke with patient and scheduled EEG for 03/05/24 at 2pm

## 2024-03-04 NOTE — Telephone Encounter (Signed)
 I called pt and made appt for him to see NP Lauraine Born at  03-12-2024 at 0815 . Arrive at 0800.  He is taking the generic keppra  500mg  po bid. He needs EEG scheduled.  MRI resulted as normal from ED.  He is not taking the divalproex . Appreciated call back.

## 2024-03-05 ENCOUNTER — Encounter: Payer: Self-pay | Admitting: *Deleted

## 2024-03-05 ENCOUNTER — Other Ambulatory Visit: Admitting: *Deleted

## 2024-03-12 ENCOUNTER — Encounter: Payer: Self-pay | Admitting: Neurology

## 2024-03-12 ENCOUNTER — Ambulatory Visit: Admitting: Neurology

## 2024-03-12 VITALS — BP 111/66 | HR 71 | Ht 68.0 in | Wt 190.2 lb

## 2024-03-12 DIAGNOSIS — G40301 Generalized idiopathic epilepsy and epileptic syndromes, not intractable, with status epilepticus: Secondary | ICD-10-CM | POA: Diagnosis not present

## 2024-03-12 MED ORDER — DIVALPROEX SODIUM ER 500 MG PO TB24: 500.0000 mg | ORAL_TABLET | Freq: Every day | ORAL | 11 refills | Status: AC

## 2024-03-12 NOTE — Progress Notes (Signed)
 Chart reviewed, agree above plan ?
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
## 2024-03-12 NOTE — Patient Instructions (Addendum)
 Start Depakote  ER 500 mg at bedtime for seizure prevention, call for seizures  Schedule EEG  No driving until seizure free for 6 months

## 2024-03-18 ENCOUNTER — Emergency Department (HOSPITAL_COMMUNITY)

## 2024-03-18 ENCOUNTER — Other Ambulatory Visit: Payer: Self-pay

## 2024-03-18 ENCOUNTER — Other Ambulatory Visit: Admitting: *Deleted

## 2024-03-18 ENCOUNTER — Emergency Department (HOSPITAL_COMMUNITY)
Admission: EM | Admit: 2024-03-18 | Discharge: 2024-03-18 | Disposition: A | Discharge status: Home / Self Care | Attending: Emergency Medicine | Admitting: Emergency Medicine

## 2024-03-18 DIAGNOSIS — S9031XA Contusion of right foot, initial encounter: Secondary | ICD-10-CM | POA: Insufficient documentation

## 2024-03-18 DIAGNOSIS — W228XXA Striking against or struck by other objects, initial encounter: Secondary | ICD-10-CM | POA: Diagnosis not present

## 2024-03-18 DIAGNOSIS — S99921A Unspecified injury of right foot, initial encounter: Secondary | ICD-10-CM | POA: Diagnosis present

## 2024-03-18 MED ORDER — OXYCODONE-ACETAMINOPHEN 5-325 MG PO TABS
1.0000 | ORAL_TABLET | Freq: Once | ORAL | Status: AC
  Administered 2024-03-18: 1 via ORAL
  Filled 2024-03-18: qty 1

## 2024-03-18 NOTE — ED Provider Notes (Signed)
 " [place] EMERGENCY DEPARTMENT AT [facility] Provider Note   CSN: [id number] Arrival date & time: 03/18/24  0502     Patient presents with: Foot Injury   Darius Bailey is a 39 y.o. male.   The history is provided by the patient.  Foot Injury Darius Bailey is a 38 y.o. male who presents to the Emergency Department complaining of foot injury.  He presents to the emergency department for evaluation of right foot injury.  He states that he was wearing some sneakers when he kicked a pole around 9 or 10 PM he was able to continue watching a football game, later went home and went to bed.  When he woke up to try to urinate he reports severe pain throughout his foot and cannot bear weight.  Pain is worse over the lateral aspect of the foot.  He did not have any twisting injury or fall.  He has a history of seizure disorder and takes Keppra , no additional medical problems.  Denies any tobacco, alcohol, drug use.     Prior to Admission medications  Medication Sig Start Date End Date Taking? Authorizing Provider  albuterol  (VENTOLIN  HFA) 108 (90 Base) MCG/ACT inhaler Inhale 2 puffs into the lungs every 6 (six) hours as needed for wheezing or shortness of breath (Cough). 06/02/21   Joesph Shaver Scales, PA-C  divalproex  (DEPAKOTE  ER) 500 MG 24 hr tablet Take 1 tablet (500 mg total) by mouth at bedtime. 03/12/24   Gayland Lauraine PARAS, NP    Allergies: Amoxil [amoxicillin], Penicillin g, and Penicillins    Review of Systems  All other systems reviewed and are negative.   Updated Vital Signs BP (!) 136/90   Pulse 78   Temp 98.1 F (36.7 C) (Oral)   Resp 17   SpO2 97%   Physical Exam Vitals and nursing note reviewed.  Constitutional:      Appearance: He is well-developed.  HENT:     Head: Normocephalic and atraumatic.  Cardiovascular:     Rate and Rhythm: Normal rate and regular rhythm.  Pulmonary:     Effort: Pulmonary effort is normal. No respiratory distress.   Musculoskeletal:     Comments: Mild soft tissue swelling throughout the right foot.  2+ DP pulses bilaterally.  There is tenderness to palpation throughout the entire foot, slightly greater over the lateral aspect of the foot.  He can wiggle the toes.  No tenderness over the ankle, knee.  He is able to flex and extend at the ankle, knee.  Skin:    General: Skin is warm and dry.  Neurological:     Mental Status: He is alert and oriented to person, place, and time.  Psychiatric:        Behavior: Behavior normal.     (all labs ordered are listed, but only abnormal results are displayed) Labs Reviewed - No data to display  EKG: None  Radiology: DG Foot Complete Right Result Date: 03/18/2024 CLINICAL DATA:  Foot pain after kicking a pole. EXAM: RIGHT FOOT COMPLETE - 3+ VIEW COMPARISON:  None Available. FINDINGS: There is no evidence of fracture or dislocation. There is no evidence of arthropathy or other focal bone abnormality. Soft tissues are unremarkable. IMPRESSION: Negative. Electronically Signed   By: Camellia Candle M.D.   On: 03/18/2024 05:49     Procedures   Medications Ordered in the ED  oxyCODONE -acetaminophen  (PERCOCET/ROXICET) 5-325 MG per tablet 1 tablet (1 tablet Oral Given 03/18/24 0520)  Medical Decision Making Amount and/or Complexity of Data Reviewed Radiology: ordered.  Risk Prescription drug management.   Patient with history of seizure disorder here for evaluation of right foot pain after kicking a pole.  He does have diffuse tenderness throughout the foot on examination with normal perfusion.  Plain film is negative for acute fracture or dislocation-images personally reviewed and interpreted, agree with radiologist interpretation.  Discussed with patient home care for foot contusion.  Current picture is not consistent with Lisfranc injury, compartment syndrome.  Discussed weightbearing as tolerated.  Will provide a postop shoe.   Discussed OTC analgesics that he may take for pain.  Discussed outpatient follow-up as well as return precautions.  Of note patient does discuss being under increased stressors recently.  Offered resources for behavioral health regarding counseling and additional mental health assistance.       Final diagnoses:  Contusion of right foot, initial encounter    ED Discharge Orders     None          Griselda Norris, MD 03/18/24 [phone]  "

## 2024-03-18 NOTE — ED Triage Notes (Addendum)
 Pt came in via EMS from home w/ c/o of R foot pain. Pt reports kicking a pole last night. Unable to bare pressure or wiggle toes since about 4am this morning. Sensations intact. No obvious deformity.

## 2024-03-18 NOTE — ED Notes (Signed)
Pt declined wheelchair to lobby

## 2024-03-21 ENCOUNTER — Ambulatory Visit: Admitting: Neurology

## 2024-03-21 DIAGNOSIS — G40301 Generalized idiopathic epilepsy and epileptic syndromes, not intractable, with status epilepticus: Secondary | ICD-10-CM | POA: Diagnosis not present

## 2024-03-22 NOTE — Procedures (Signed)
" ° °  HISTORY: 39 year old male with generalized seizure  TECHNIQUE:  This is a routine 16 channel EEG recording with one channel devoted to a limited EKG recording.  It was performed during wakefulness, drowsiness and asleep.  Hyperventilation and photic stimulation were performed as activating procedures.  There are minimum muscle and movement artifact noted.  Upon maximum arousal, posterior dominant waking rhythm consistent of rhythmic alpha range activity. Activities are symmetric over the bilateral posterior derivations and attenuated with eye opening.  Photic stimulation did not alter the tracing.  Hyperventilation produced mild/moderate buildup with higher amplitude and the slower activities noted.  During EEG recording, patient developed drowsiness and entered sleep, sleep EEG demonstrated architecture, there were frontal centrally dominant vertex waves and symmetric sleep spindles noted.  During EEG recording, there was no epileptiform discharge noted.  EKG demonstrate normal sinus rhythm.  CONCLUSION: This is a  normal awake and asleep EEG.  There is no electrodiagnostic evidence of epileptiform discharge.  Akera Snowberger, M.D. Ph.D.  [facility] [address] [place], [place] [address] Phone: [phone] Fax:      [phone]  "

## 2024-03-26 ENCOUNTER — Ambulatory Visit: Payer: Self-pay | Admitting: Neurology

## 2024-04-01 ENCOUNTER — Ambulatory Visit (HOSPITAL_COMMUNITY)
Admission: RE | Admit: 2024-04-01 | Discharge: 2024-04-01 | Disposition: A | Payer: Self-pay | Source: Ambulatory Visit | Attending: Urology

## 2024-04-01 DIAGNOSIS — D49511 Neoplasm of unspecified behavior of right kidney: Secondary | ICD-10-CM | POA: Diagnosis present

## 2024-04-01 MED ORDER — GADOBUTROL 1 MMOL/ML IV SOLN
9.0000 mL | Freq: Once | INTRAVENOUS | Status: AC | PRN
  Administered 2024-04-01: 9 mL via INTRAVENOUS

## 2024-04-17 ENCOUNTER — Other Ambulatory Visit: Payer: Self-pay | Admitting: Urology

## 2024-05-24 ENCOUNTER — Ambulatory Visit (HOSPITAL_COMMUNITY): Admit: 2024-05-24 | Admitting: Urology

## 2024-09-10 ENCOUNTER — Ambulatory Visit: Admitting: Neurology
